# Patient Record
Sex: Female | Born: 1945 | Race: Black or African American | Hispanic: No | Marital: Married | Smoking: Former smoker
Health system: Southern US, Community
[De-identification: ages and names within clinical notes are randomized; demographics above are authoritative.]

## PROBLEM LIST (undated history)

## (undated) DIAGNOSIS — N189 Chronic kidney disease, unspecified: Secondary | ICD-10-CM

## (undated) DIAGNOSIS — E7800 Pure hypercholesterolemia, unspecified: Secondary | ICD-10-CM

## (undated) DIAGNOSIS — I10 Essential (primary) hypertension: Secondary | ICD-10-CM

## (undated) DIAGNOSIS — K219 Gastro-esophageal reflux disease without esophagitis: Secondary | ICD-10-CM

## (undated) DIAGNOSIS — E119 Type 2 diabetes mellitus without complications: Secondary | ICD-10-CM

## (undated) DIAGNOSIS — B259 Cytomegaloviral disease, unspecified: Secondary | ICD-10-CM

## (undated) DIAGNOSIS — M1990 Unspecified osteoarthritis, unspecified site: Secondary | ICD-10-CM

## (undated) HISTORY — PX: ABDOMINAL HYSTERECTOMY: SHX81

## (undated) HISTORY — PX: BREAST SURGERY: SHX581

## (undated) HISTORY — PX: BREAST CYST ASPIRATION: SHX578

## (undated) HISTORY — PX: TONSILLECTOMY: SUR1361

## (undated) HISTORY — PX: DILATION AND CURETTAGE OF UTERUS: SHX78

## (undated) HISTORY — PX: CHOLECYSTECTOMY: SHX55

---

## 2012-04-15 HISTORY — PX: KIDNEY TRANSPLANT: SHX239

## 2012-04-25 DIAGNOSIS — D649 Anemia, unspecified: Secondary | ICD-10-CM | POA: Diagnosis not present

## 2012-04-25 DIAGNOSIS — Q613 Polycystic kidney, unspecified: Secondary | ICD-10-CM | POA: Diagnosis not present

## 2012-04-28 DIAGNOSIS — E785 Hyperlipidemia, unspecified: Secondary | ICD-10-CM | POA: Diagnosis not present

## 2012-04-28 DIAGNOSIS — I4891 Unspecified atrial fibrillation: Secondary | ICD-10-CM | POA: Diagnosis not present

## 2012-04-28 DIAGNOSIS — E669 Obesity, unspecified: Secondary | ICD-10-CM | POA: Diagnosis not present

## 2012-04-28 DIAGNOSIS — Z940 Kidney transplant status: Secondary | ICD-10-CM | POA: Diagnosis not present

## 2012-04-28 DIAGNOSIS — N250 Renal osteodystrophy: Secondary | ICD-10-CM | POA: Diagnosis present

## 2012-04-28 DIAGNOSIS — E213 Hyperparathyroidism, unspecified: Secondary | ICD-10-CM | POA: Diagnosis not present

## 2012-04-28 DIAGNOSIS — R509 Fever, unspecified: Secondary | ICD-10-CM | POA: Diagnosis not present

## 2012-04-28 DIAGNOSIS — D259 Leiomyoma of uterus, unspecified: Secondary | ICD-10-CM | POA: Diagnosis not present

## 2012-04-28 DIAGNOSIS — I120 Hypertensive chronic kidney disease with stage 5 chronic kidney disease or end stage renal disease: Secondary | ICD-10-CM | POA: Diagnosis not present

## 2012-04-28 DIAGNOSIS — N186 End stage renal disease: Secondary | ICD-10-CM | POA: Diagnosis present

## 2012-04-28 DIAGNOSIS — R918 Other nonspecific abnormal finding of lung field: Secondary | ICD-10-CM | POA: Diagnosis not present

## 2012-04-28 DIAGNOSIS — N039 Chronic nephritic syndrome with unspecified morphologic changes: Secondary | ICD-10-CM | POA: Diagnosis present

## 2012-04-28 DIAGNOSIS — Z79899 Other long term (current) drug therapy: Secondary | ICD-10-CM | POA: Diagnosis not present

## 2012-04-28 DIAGNOSIS — Q613 Polycystic kidney, unspecified: Secondary | ICD-10-CM | POA: Diagnosis not present

## 2012-05-04 DIAGNOSIS — Z940 Kidney transplant status: Secondary | ICD-10-CM | POA: Diagnosis not present

## 2012-05-04 DIAGNOSIS — D8989 Other specified disorders involving the immune mechanism, not elsewhere classified: Secondary | ICD-10-CM | POA: Diagnosis not present

## 2012-05-04 DIAGNOSIS — Z79899 Other long term (current) drug therapy: Secondary | ICD-10-CM | POA: Diagnosis not present

## 2012-05-04 DIAGNOSIS — R7989 Other specified abnormal findings of blood chemistry: Secondary | ICD-10-CM | POA: Diagnosis not present

## 2012-05-04 DIAGNOSIS — D631 Anemia in chronic kidney disease: Secondary | ICD-10-CM | POA: Diagnosis not present

## 2012-05-04 DIAGNOSIS — N184 Chronic kidney disease, stage 4 (severe): Secondary | ICD-10-CM | POA: Diagnosis not present

## 2012-05-08 DIAGNOSIS — I10 Essential (primary) hypertension: Secondary | ICD-10-CM | POA: Diagnosis not present

## 2012-05-08 DIAGNOSIS — Z79899 Other long term (current) drug therapy: Secondary | ICD-10-CM | POA: Diagnosis not present

## 2012-05-08 DIAGNOSIS — N390 Urinary tract infection, site not specified: Secondary | ICD-10-CM | POA: Diagnosis not present

## 2012-05-08 DIAGNOSIS — Z940 Kidney transplant status: Secondary | ICD-10-CM | POA: Diagnosis not present

## 2012-05-08 DIAGNOSIS — R809 Proteinuria, unspecified: Secondary | ICD-10-CM | POA: Diagnosis not present

## 2012-05-11 DIAGNOSIS — I10 Essential (primary) hypertension: Secondary | ICD-10-CM | POA: Diagnosis not present

## 2012-05-11 DIAGNOSIS — E785 Hyperlipidemia, unspecified: Secondary | ICD-10-CM | POA: Diagnosis not present

## 2012-05-11 DIAGNOSIS — Z940 Kidney transplant status: Secondary | ICD-10-CM | POA: Diagnosis not present

## 2012-05-11 DIAGNOSIS — Z79899 Other long term (current) drug therapy: Secondary | ICD-10-CM | POA: Diagnosis not present

## 2012-05-15 DIAGNOSIS — Z79899 Other long term (current) drug therapy: Secondary | ICD-10-CM | POA: Diagnosis not present

## 2012-05-15 DIAGNOSIS — R7989 Other specified abnormal findings of blood chemistry: Secondary | ICD-10-CM | POA: Diagnosis not present

## 2012-05-15 DIAGNOSIS — D8989 Other specified disorders involving the immune mechanism, not elsewhere classified: Secondary | ICD-10-CM | POA: Diagnosis not present

## 2012-05-15 DIAGNOSIS — Z940 Kidney transplant status: Secondary | ICD-10-CM | POA: Diagnosis not present

## 2012-05-18 DIAGNOSIS — D509 Iron deficiency anemia, unspecified: Secondary | ICD-10-CM | POA: Diagnosis not present

## 2012-05-18 DIAGNOSIS — Z940 Kidney transplant status: Secondary | ICD-10-CM | POA: Diagnosis not present

## 2012-05-18 DIAGNOSIS — Z79899 Other long term (current) drug therapy: Secondary | ICD-10-CM | POA: Diagnosis not present

## 2012-05-18 DIAGNOSIS — N039 Chronic nephritic syndrome with unspecified morphologic changes: Secondary | ICD-10-CM | POA: Diagnosis not present

## 2012-05-18 DIAGNOSIS — R7989 Other specified abnormal findings of blood chemistry: Secondary | ICD-10-CM | POA: Diagnosis not present

## 2012-05-18 DIAGNOSIS — D631 Anemia in chronic kidney disease: Secondary | ICD-10-CM | POA: Diagnosis not present

## 2012-05-22 DIAGNOSIS — D8989 Other specified disorders involving the immune mechanism, not elsewhere classified: Secondary | ICD-10-CM | POA: Diagnosis not present

## 2012-05-22 DIAGNOSIS — N039 Chronic nephritic syndrome with unspecified morphologic changes: Secondary | ICD-10-CM | POA: Diagnosis not present

## 2012-05-22 DIAGNOSIS — Z79899 Other long term (current) drug therapy: Secondary | ICD-10-CM | POA: Diagnosis not present

## 2012-05-22 DIAGNOSIS — Z940 Kidney transplant status: Secondary | ICD-10-CM | POA: Diagnosis not present

## 2012-05-22 DIAGNOSIS — R7989 Other specified abnormal findings of blood chemistry: Secondary | ICD-10-CM | POA: Diagnosis not present

## 2012-05-25 DIAGNOSIS — R7989 Other specified abnormal findings of blood chemistry: Secondary | ICD-10-CM | POA: Diagnosis not present

## 2012-05-25 DIAGNOSIS — Z940 Kidney transplant status: Secondary | ICD-10-CM | POA: Diagnosis not present

## 2012-05-25 DIAGNOSIS — Z79899 Other long term (current) drug therapy: Secondary | ICD-10-CM | POA: Diagnosis not present

## 2012-05-25 DIAGNOSIS — N039 Chronic nephritic syndrome with unspecified morphologic changes: Secondary | ICD-10-CM | POA: Diagnosis not present

## 2012-05-25 DIAGNOSIS — D8989 Other specified disorders involving the immune mechanism, not elsewhere classified: Secondary | ICD-10-CM | POA: Diagnosis not present

## 2012-05-30 DIAGNOSIS — Z79899 Other long term (current) drug therapy: Secondary | ICD-10-CM | POA: Diagnosis not present

## 2012-05-30 DIAGNOSIS — R7989 Other specified abnormal findings of blood chemistry: Secondary | ICD-10-CM | POA: Diagnosis not present

## 2012-05-30 DIAGNOSIS — D8989 Other specified disorders involving the immune mechanism, not elsewhere classified: Secondary | ICD-10-CM | POA: Diagnosis not present

## 2012-05-30 DIAGNOSIS — Z940 Kidney transplant status: Secondary | ICD-10-CM | POA: Diagnosis not present

## 2012-06-06 DIAGNOSIS — Z79899 Other long term (current) drug therapy: Secondary | ICD-10-CM | POA: Diagnosis not present

## 2012-06-06 DIAGNOSIS — N390 Urinary tract infection, site not specified: Secondary | ICD-10-CM | POA: Diagnosis not present

## 2012-06-06 DIAGNOSIS — R7989 Other specified abnormal findings of blood chemistry: Secondary | ICD-10-CM | POA: Diagnosis not present

## 2012-06-06 DIAGNOSIS — N184 Chronic kidney disease, stage 4 (severe): Secondary | ICD-10-CM | POA: Diagnosis not present

## 2012-06-06 DIAGNOSIS — Z940 Kidney transplant status: Secondary | ICD-10-CM | POA: Diagnosis not present

## 2012-06-06 DIAGNOSIS — N039 Chronic nephritic syndrome with unspecified morphologic changes: Secondary | ICD-10-CM | POA: Diagnosis not present

## 2012-06-06 DIAGNOSIS — I10 Essential (primary) hypertension: Secondary | ICD-10-CM | POA: Diagnosis not present

## 2012-06-06 DIAGNOSIS — D631 Anemia in chronic kidney disease: Secondary | ICD-10-CM | POA: Diagnosis not present

## 2012-06-06 DIAGNOSIS — R809 Proteinuria, unspecified: Secondary | ICD-10-CM | POA: Diagnosis not present

## 2012-06-13 DIAGNOSIS — B259 Cytomegaloviral disease, unspecified: Secondary | ICD-10-CM | POA: Diagnosis not present

## 2012-06-13 DIAGNOSIS — T8610 Unspecified complication of kidney transplant: Secondary | ICD-10-CM | POA: Diagnosis not present

## 2012-06-21 DIAGNOSIS — N390 Urinary tract infection, site not specified: Secondary | ICD-10-CM | POA: Diagnosis not present

## 2012-06-21 DIAGNOSIS — R7989 Other specified abnormal findings of blood chemistry: Secondary | ICD-10-CM | POA: Diagnosis not present

## 2012-06-21 DIAGNOSIS — D8989 Other specified disorders involving the immune mechanism, not elsewhere classified: Secondary | ICD-10-CM | POA: Diagnosis not present

## 2012-06-21 DIAGNOSIS — R809 Proteinuria, unspecified: Secondary | ICD-10-CM | POA: Diagnosis not present

## 2012-06-21 DIAGNOSIS — Z940 Kidney transplant status: Secondary | ICD-10-CM | POA: Diagnosis not present

## 2012-06-21 DIAGNOSIS — B259 Cytomegaloviral disease, unspecified: Secondary | ICD-10-CM | POA: Diagnosis not present

## 2012-06-21 DIAGNOSIS — N039 Chronic nephritic syndrome with unspecified morphologic changes: Secondary | ICD-10-CM | POA: Diagnosis not present

## 2012-06-21 DIAGNOSIS — Z79899 Other long term (current) drug therapy: Secondary | ICD-10-CM | POA: Diagnosis not present

## 2012-06-25 DIAGNOSIS — T8610 Unspecified complication of kidney transplant: Secondary | ICD-10-CM | POA: Diagnosis not present

## 2012-06-25 DIAGNOSIS — Z940 Kidney transplant status: Secondary | ICD-10-CM | POA: Diagnosis not present

## 2012-06-25 DIAGNOSIS — B9789 Other viral agents as the cause of diseases classified elsewhere: Secondary | ICD-10-CM | POA: Diagnosis not present

## 2012-06-25 DIAGNOSIS — B259 Cytomegaloviral disease, unspecified: Secondary | ICD-10-CM | POA: Diagnosis not present

## 2012-06-25 DIAGNOSIS — Z79899 Other long term (current) drug therapy: Secondary | ICD-10-CM | POA: Diagnosis not present

## 2012-06-25 DIAGNOSIS — Z48298 Encounter for aftercare following other organ transplant: Secondary | ICD-10-CM | POA: Diagnosis not present

## 2012-07-04 DIAGNOSIS — R7989 Other specified abnormal findings of blood chemistry: Secondary | ICD-10-CM | POA: Diagnosis not present

## 2012-07-04 DIAGNOSIS — E119 Type 2 diabetes mellitus without complications: Secondary | ICD-10-CM | POA: Diagnosis not present

## 2012-07-04 DIAGNOSIS — R809 Proteinuria, unspecified: Secondary | ICD-10-CM | POA: Diagnosis not present

## 2012-07-04 DIAGNOSIS — B259 Cytomegaloviral disease, unspecified: Secondary | ICD-10-CM | POA: Diagnosis not present

## 2012-07-04 DIAGNOSIS — Z79899 Other long term (current) drug therapy: Secondary | ICD-10-CM | POA: Diagnosis not present

## 2012-07-04 DIAGNOSIS — D8989 Other specified disorders involving the immune mechanism, not elsewhere classified: Secondary | ICD-10-CM | POA: Diagnosis not present

## 2012-07-04 DIAGNOSIS — Z940 Kidney transplant status: Secondary | ICD-10-CM | POA: Diagnosis not present

## 2012-07-04 DIAGNOSIS — I10 Essential (primary) hypertension: Secondary | ICD-10-CM | POA: Diagnosis not present

## 2012-07-11 DIAGNOSIS — N039 Chronic nephritic syndrome with unspecified morphologic changes: Secondary | ICD-10-CM | POA: Diagnosis not present

## 2012-07-11 DIAGNOSIS — Z79899 Other long term (current) drug therapy: Secondary | ICD-10-CM | POA: Diagnosis not present

## 2012-07-11 DIAGNOSIS — Z940 Kidney transplant status: Secondary | ICD-10-CM | POA: Diagnosis not present

## 2012-07-11 DIAGNOSIS — D8989 Other specified disorders involving the immune mechanism, not elsewhere classified: Secondary | ICD-10-CM | POA: Diagnosis not present

## 2012-07-11 DIAGNOSIS — D631 Anemia in chronic kidney disease: Secondary | ICD-10-CM | POA: Diagnosis not present

## 2012-07-11 DIAGNOSIS — B259 Cytomegaloviral disease, unspecified: Secondary | ICD-10-CM | POA: Diagnosis not present

## 2012-07-18 DIAGNOSIS — Z940 Kidney transplant status: Secondary | ICD-10-CM | POA: Diagnosis not present

## 2012-07-18 DIAGNOSIS — Z79899 Other long term (current) drug therapy: Secondary | ICD-10-CM | POA: Diagnosis not present

## 2012-07-18 DIAGNOSIS — Z48298 Encounter for aftercare following other organ transplant: Secondary | ICD-10-CM | POA: Diagnosis not present

## 2012-07-18 DIAGNOSIS — B259 Cytomegaloviral disease, unspecified: Secondary | ICD-10-CM | POA: Diagnosis not present

## 2012-07-25 DIAGNOSIS — Z940 Kidney transplant status: Secondary | ICD-10-CM | POA: Diagnosis not present

## 2012-07-25 DIAGNOSIS — Z79899 Other long term (current) drug therapy: Secondary | ICD-10-CM | POA: Diagnosis not present

## 2012-07-27 DIAGNOSIS — E559 Vitamin D deficiency, unspecified: Secondary | ICD-10-CM | POA: Diagnosis not present

## 2012-07-27 DIAGNOSIS — I10 Essential (primary) hypertension: Secondary | ICD-10-CM | POA: Diagnosis not present

## 2012-07-27 DIAGNOSIS — Z940 Kidney transplant status: Secondary | ICD-10-CM | POA: Diagnosis not present

## 2012-07-27 DIAGNOSIS — E139 Other specified diabetes mellitus without complications: Secondary | ICD-10-CM | POA: Diagnosis not present

## 2012-07-27 DIAGNOSIS — E785 Hyperlipidemia, unspecified: Secondary | ICD-10-CM | POA: Diagnosis not present

## 2012-07-27 DIAGNOSIS — B259 Cytomegaloviral disease, unspecified: Secondary | ICD-10-CM | POA: Diagnosis not present

## 2012-08-01 DIAGNOSIS — N039 Chronic nephritic syndrome with unspecified morphologic changes: Secondary | ICD-10-CM | POA: Diagnosis not present

## 2012-08-01 DIAGNOSIS — B259 Cytomegaloviral disease, unspecified: Secondary | ICD-10-CM | POA: Diagnosis not present

## 2012-08-01 DIAGNOSIS — B9789 Other viral agents as the cause of diseases classified elsewhere: Secondary | ICD-10-CM | POA: Diagnosis not present

## 2012-08-01 DIAGNOSIS — E559 Vitamin D deficiency, unspecified: Secondary | ICD-10-CM | POA: Diagnosis not present

## 2012-08-01 DIAGNOSIS — R7989 Other specified abnormal findings of blood chemistry: Secondary | ICD-10-CM | POA: Diagnosis not present

## 2012-08-01 DIAGNOSIS — E119 Type 2 diabetes mellitus without complications: Secondary | ICD-10-CM | POA: Diagnosis not present

## 2012-08-01 DIAGNOSIS — Z940 Kidney transplant status: Secondary | ICD-10-CM | POA: Diagnosis not present

## 2012-08-01 DIAGNOSIS — Z79899 Other long term (current) drug therapy: Secondary | ICD-10-CM | POA: Diagnosis not present

## 2012-08-01 DIAGNOSIS — D8989 Other specified disorders involving the immune mechanism, not elsewhere classified: Secondary | ICD-10-CM | POA: Diagnosis not present

## 2012-08-01 DIAGNOSIS — D631 Anemia in chronic kidney disease: Secondary | ICD-10-CM | POA: Diagnosis not present

## 2012-08-07 DIAGNOSIS — Z79899 Other long term (current) drug therapy: Secondary | ICD-10-CM | POA: Diagnosis not present

## 2012-08-07 DIAGNOSIS — Z940 Kidney transplant status: Secondary | ICD-10-CM | POA: Diagnosis not present

## 2012-08-14 DIAGNOSIS — F819 Developmental disorder of scholastic skills, unspecified: Secondary | ICD-10-CM | POA: Diagnosis not present

## 2012-08-14 DIAGNOSIS — Z940 Kidney transplant status: Secondary | ICD-10-CM | POA: Diagnosis not present

## 2012-08-14 DIAGNOSIS — Z79899 Other long term (current) drug therapy: Secondary | ICD-10-CM | POA: Diagnosis not present

## 2012-08-20 DIAGNOSIS — E663 Overweight: Secondary | ICD-10-CM | POA: Diagnosis not present

## 2012-08-20 DIAGNOSIS — E785 Hyperlipidemia, unspecified: Secondary | ICD-10-CM | POA: Diagnosis not present

## 2012-08-20 DIAGNOSIS — Z0000 Encounter for general adult medical examination without abnormal findings: Secondary | ICD-10-CM | POA: Diagnosis not present

## 2012-08-20 DIAGNOSIS — I10 Essential (primary) hypertension: Secondary | ICD-10-CM | POA: Diagnosis not present

## 2012-08-22 DIAGNOSIS — Z940 Kidney transplant status: Secondary | ICD-10-CM | POA: Diagnosis not present

## 2012-08-22 DIAGNOSIS — Z79899 Other long term (current) drug therapy: Secondary | ICD-10-CM | POA: Diagnosis not present

## 2012-08-23 DIAGNOSIS — E139 Other specified diabetes mellitus without complications: Secondary | ICD-10-CM | POA: Diagnosis not present

## 2012-08-23 DIAGNOSIS — Z940 Kidney transplant status: Secondary | ICD-10-CM | POA: Diagnosis not present

## 2012-08-29 DIAGNOSIS — Z79899 Other long term (current) drug therapy: Secondary | ICD-10-CM | POA: Diagnosis not present

## 2012-08-29 DIAGNOSIS — R7989 Other specified abnormal findings of blood chemistry: Secondary | ICD-10-CM | POA: Diagnosis not present

## 2012-08-29 DIAGNOSIS — D8989 Other specified disorders involving the immune mechanism, not elsewhere classified: Secondary | ICD-10-CM | POA: Diagnosis not present

## 2012-08-29 DIAGNOSIS — Z940 Kidney transplant status: Secondary | ICD-10-CM | POA: Diagnosis not present

## 2012-09-05 DIAGNOSIS — Z79899 Other long term (current) drug therapy: Secondary | ICD-10-CM | POA: Diagnosis not present

## 2012-09-05 DIAGNOSIS — T8610 Unspecified complication of kidney transplant: Secondary | ICD-10-CM | POA: Diagnosis not present

## 2012-09-05 DIAGNOSIS — D631 Anemia in chronic kidney disease: Secondary | ICD-10-CM | POA: Diagnosis not present

## 2012-09-05 DIAGNOSIS — Z940 Kidney transplant status: Secondary | ICD-10-CM | POA: Diagnosis not present

## 2012-09-05 DIAGNOSIS — R7989 Other specified abnormal findings of blood chemistry: Secondary | ICD-10-CM | POA: Diagnosis not present

## 2012-09-05 DIAGNOSIS — D8989 Other specified disorders involving the immune mechanism, not elsewhere classified: Secondary | ICD-10-CM | POA: Diagnosis not present

## 2012-09-05 DIAGNOSIS — N039 Chronic nephritic syndrome with unspecified morphologic changes: Secondary | ICD-10-CM | POA: Diagnosis not present

## 2012-09-05 DIAGNOSIS — B259 Cytomegaloviral disease, unspecified: Secondary | ICD-10-CM | POA: Diagnosis not present

## 2012-09-10 DIAGNOSIS — E785 Hyperlipidemia, unspecified: Secondary | ICD-10-CM | POA: Diagnosis not present

## 2012-09-10 DIAGNOSIS — E139 Other specified diabetes mellitus without complications: Secondary | ICD-10-CM | POA: Diagnosis not present

## 2012-09-13 DIAGNOSIS — E559 Vitamin D deficiency, unspecified: Secondary | ICD-10-CM | POA: Diagnosis not present

## 2012-09-13 DIAGNOSIS — R7989 Other specified abnormal findings of blood chemistry: Secondary | ICD-10-CM | POA: Diagnosis not present

## 2012-09-13 DIAGNOSIS — E139 Other specified diabetes mellitus without complications: Secondary | ICD-10-CM | POA: Diagnosis not present

## 2012-09-13 DIAGNOSIS — I10 Essential (primary) hypertension: Secondary | ICD-10-CM | POA: Diagnosis not present

## 2012-09-13 DIAGNOSIS — E785 Hyperlipidemia, unspecified: Secondary | ICD-10-CM | POA: Diagnosis not present

## 2012-09-13 DIAGNOSIS — Z940 Kidney transplant status: Secondary | ICD-10-CM | POA: Diagnosis not present

## 2012-09-13 DIAGNOSIS — Z79899 Other long term (current) drug therapy: Secondary | ICD-10-CM | POA: Diagnosis not present

## 2012-09-13 DIAGNOSIS — D8989 Other specified disorders involving the immune mechanism, not elsewhere classified: Secondary | ICD-10-CM | POA: Diagnosis not present

## 2012-09-13 DIAGNOSIS — B259 Cytomegaloviral disease, unspecified: Secondary | ICD-10-CM | POA: Diagnosis not present

## 2012-09-17 ENCOUNTER — Other Ambulatory Visit: Payer: Self-pay | Admitting: Internal Medicine

## 2012-09-17 DIAGNOSIS — Z1231 Encounter for screening mammogram for malignant neoplasm of breast: Secondary | ICD-10-CM

## 2012-09-19 DIAGNOSIS — D8989 Other specified disorders involving the immune mechanism, not elsewhere classified: Secondary | ICD-10-CM | POA: Diagnosis not present

## 2012-09-19 DIAGNOSIS — Z79899 Other long term (current) drug therapy: Secondary | ICD-10-CM | POA: Diagnosis not present

## 2012-09-19 DIAGNOSIS — R7989 Other specified abnormal findings of blood chemistry: Secondary | ICD-10-CM | POA: Diagnosis not present

## 2012-09-19 DIAGNOSIS — Z940 Kidney transplant status: Secondary | ICD-10-CM | POA: Diagnosis not present

## 2012-09-26 DIAGNOSIS — D8989 Other specified disorders involving the immune mechanism, not elsewhere classified: Secondary | ICD-10-CM | POA: Diagnosis not present

## 2012-09-26 DIAGNOSIS — Z79899 Other long term (current) drug therapy: Secondary | ICD-10-CM | POA: Diagnosis not present

## 2012-09-26 DIAGNOSIS — R7989 Other specified abnormal findings of blood chemistry: Secondary | ICD-10-CM | POA: Diagnosis not present

## 2012-09-26 DIAGNOSIS — Z940 Kidney transplant status: Secondary | ICD-10-CM | POA: Diagnosis not present

## 2012-10-01 ENCOUNTER — Ambulatory Visit
Admission: RE | Admit: 2012-10-01 | Discharge: 2012-10-01 | Disposition: A | Discharge status: Home / Self Care | Payer: Medicare Other | Source: Ambulatory Visit | Attending: Internal Medicine | Admitting: Internal Medicine

## 2012-10-01 DIAGNOSIS — Z1231 Encounter for screening mammogram for malignant neoplasm of breast: Secondary | ICD-10-CM

## 2012-10-03 DIAGNOSIS — Z940 Kidney transplant status: Secondary | ICD-10-CM | POA: Diagnosis not present

## 2012-10-03 DIAGNOSIS — Z79899 Other long term (current) drug therapy: Secondary | ICD-10-CM | POA: Diagnosis not present

## 2012-10-17 DIAGNOSIS — Z940 Kidney transplant status: Secondary | ICD-10-CM | POA: Diagnosis not present

## 2012-10-17 DIAGNOSIS — D8989 Other specified disorders involving the immune mechanism, not elsewhere classified: Secondary | ICD-10-CM | POA: Diagnosis not present

## 2012-10-24 DIAGNOSIS — Z79899 Other long term (current) drug therapy: Secondary | ICD-10-CM | POA: Diagnosis not present

## 2012-11-02 DIAGNOSIS — E119 Type 2 diabetes mellitus without complications: Secondary | ICD-10-CM | POA: Diagnosis not present

## 2012-11-02 DIAGNOSIS — Z940 Kidney transplant status: Secondary | ICD-10-CM | POA: Diagnosis not present

## 2012-11-02 DIAGNOSIS — B259 Cytomegaloviral disease, unspecified: Secondary | ICD-10-CM | POA: Diagnosis not present

## 2012-11-02 DIAGNOSIS — Z79899 Other long term (current) drug therapy: Secondary | ICD-10-CM | POA: Diagnosis not present

## 2012-11-02 DIAGNOSIS — E559 Vitamin D deficiency, unspecified: Secondary | ICD-10-CM | POA: Diagnosis not present

## 2012-11-07 DIAGNOSIS — R7989 Other specified abnormal findings of blood chemistry: Secondary | ICD-10-CM | POA: Diagnosis not present

## 2012-11-07 DIAGNOSIS — Z79899 Other long term (current) drug therapy: Secondary | ICD-10-CM | POA: Diagnosis not present

## 2012-11-07 DIAGNOSIS — D8989 Other specified disorders involving the immune mechanism, not elsewhere classified: Secondary | ICD-10-CM | POA: Diagnosis not present

## 2012-11-07 DIAGNOSIS — Z940 Kidney transplant status: Secondary | ICD-10-CM | POA: Diagnosis not present

## 2012-11-13 DIAGNOSIS — E139 Other specified diabetes mellitus without complications: Secondary | ICD-10-CM | POA: Diagnosis not present

## 2012-11-13 DIAGNOSIS — E785 Hyperlipidemia, unspecified: Secondary | ICD-10-CM | POA: Diagnosis not present

## 2012-11-15 DIAGNOSIS — E785 Hyperlipidemia, unspecified: Secondary | ICD-10-CM | POA: Diagnosis not present

## 2012-11-15 DIAGNOSIS — Z940 Kidney transplant status: Secondary | ICD-10-CM | POA: Diagnosis not present

## 2012-11-15 DIAGNOSIS — I10 Essential (primary) hypertension: Secondary | ICD-10-CM | POA: Diagnosis not present

## 2012-11-15 DIAGNOSIS — R7989 Other specified abnormal findings of blood chemistry: Secondary | ICD-10-CM | POA: Diagnosis not present

## 2012-11-15 DIAGNOSIS — D8989 Other specified disorders involving the immune mechanism, not elsewhere classified: Secondary | ICD-10-CM | POA: Diagnosis not present

## 2012-11-15 DIAGNOSIS — E139 Other specified diabetes mellitus without complications: Secondary | ICD-10-CM | POA: Diagnosis not present

## 2012-11-15 DIAGNOSIS — B259 Cytomegaloviral disease, unspecified: Secondary | ICD-10-CM | POA: Diagnosis not present

## 2012-11-15 DIAGNOSIS — E559 Vitamin D deficiency, unspecified: Secondary | ICD-10-CM | POA: Diagnosis not present

## 2012-11-15 DIAGNOSIS — Z79899 Other long term (current) drug therapy: Secondary | ICD-10-CM | POA: Diagnosis not present

## 2012-11-21 DIAGNOSIS — D8989 Other specified disorders involving the immune mechanism, not elsewhere classified: Secondary | ICD-10-CM | POA: Diagnosis not present

## 2012-11-21 DIAGNOSIS — E785 Hyperlipidemia, unspecified: Secondary | ICD-10-CM | POA: Diagnosis not present

## 2012-11-21 DIAGNOSIS — N951 Menopausal and female climacteric states: Secondary | ICD-10-CM | POA: Diagnosis not present

## 2012-11-21 DIAGNOSIS — Z23 Encounter for immunization: Secondary | ICD-10-CM | POA: Diagnosis not present

## 2012-11-21 DIAGNOSIS — Z0000 Encounter for general adult medical examination without abnormal findings: Secondary | ICD-10-CM | POA: Diagnosis not present

## 2012-11-21 DIAGNOSIS — R7989 Other specified abnormal findings of blood chemistry: Secondary | ICD-10-CM | POA: Diagnosis not present

## 2012-11-21 DIAGNOSIS — E139 Other specified diabetes mellitus without complications: Secondary | ICD-10-CM | POA: Diagnosis not present

## 2012-11-21 DIAGNOSIS — Z940 Kidney transplant status: Secondary | ICD-10-CM | POA: Diagnosis not present

## 2012-11-21 DIAGNOSIS — I10 Essential (primary) hypertension: Secondary | ICD-10-CM | POA: Diagnosis not present

## 2012-11-21 DIAGNOSIS — Z79899 Other long term (current) drug therapy: Secondary | ICD-10-CM | POA: Diagnosis not present

## 2012-11-21 DIAGNOSIS — E663 Overweight: Secondary | ICD-10-CM | POA: Diagnosis not present

## 2012-11-28 DIAGNOSIS — D8989 Other specified disorders involving the immune mechanism, not elsewhere classified: Secondary | ICD-10-CM | POA: Diagnosis not present

## 2012-11-28 DIAGNOSIS — R7989 Other specified abnormal findings of blood chemistry: Secondary | ICD-10-CM | POA: Diagnosis not present

## 2012-11-28 DIAGNOSIS — Z1211 Encounter for screening for malignant neoplasm of colon: Secondary | ICD-10-CM | POA: Diagnosis not present

## 2012-11-28 DIAGNOSIS — Z940 Kidney transplant status: Secondary | ICD-10-CM | POA: Diagnosis not present

## 2012-11-28 DIAGNOSIS — Z79899 Other long term (current) drug therapy: Secondary | ICD-10-CM | POA: Diagnosis not present

## 2012-12-05 DIAGNOSIS — D8989 Other specified disorders involving the immune mechanism, not elsewhere classified: Secondary | ICD-10-CM | POA: Diagnosis not present

## 2012-12-05 DIAGNOSIS — I10 Essential (primary) hypertension: Secondary | ICD-10-CM | POA: Diagnosis not present

## 2012-12-05 DIAGNOSIS — D631 Anemia in chronic kidney disease: Secondary | ICD-10-CM | POA: Diagnosis not present

## 2012-12-05 DIAGNOSIS — Z940 Kidney transplant status: Secondary | ICD-10-CM | POA: Diagnosis not present

## 2012-12-05 DIAGNOSIS — B259 Cytomegaloviral disease, unspecified: Secondary | ICD-10-CM | POA: Diagnosis not present

## 2012-12-05 DIAGNOSIS — R7989 Other specified abnormal findings of blood chemistry: Secondary | ICD-10-CM | POA: Diagnosis not present

## 2012-12-05 DIAGNOSIS — N039 Chronic nephritic syndrome with unspecified morphologic changes: Secondary | ICD-10-CM | POA: Diagnosis not present

## 2012-12-05 DIAGNOSIS — Z79899 Other long term (current) drug therapy: Secondary | ICD-10-CM | POA: Diagnosis not present

## 2012-12-13 DIAGNOSIS — D8989 Other specified disorders involving the immune mechanism, not elsewhere classified: Secondary | ICD-10-CM | POA: Diagnosis not present

## 2012-12-13 DIAGNOSIS — Z79899 Other long term (current) drug therapy: Secondary | ICD-10-CM | POA: Diagnosis not present

## 2012-12-13 DIAGNOSIS — Z940 Kidney transplant status: Secondary | ICD-10-CM | POA: Diagnosis not present

## 2012-12-13 DIAGNOSIS — R7989 Other specified abnormal findings of blood chemistry: Secondary | ICD-10-CM | POA: Diagnosis not present

## 2012-12-20 DIAGNOSIS — Z940 Kidney transplant status: Secondary | ICD-10-CM | POA: Diagnosis not present

## 2012-12-20 DIAGNOSIS — R7989 Other specified abnormal findings of blood chemistry: Secondary | ICD-10-CM | POA: Diagnosis not present

## 2012-12-20 DIAGNOSIS — Z79899 Other long term (current) drug therapy: Secondary | ICD-10-CM | POA: Diagnosis not present

## 2012-12-20 DIAGNOSIS — D8989 Other specified disorders involving the immune mechanism, not elsewhere classified: Secondary | ICD-10-CM | POA: Diagnosis not present

## 2012-12-26 DIAGNOSIS — Z79899 Other long term (current) drug therapy: Secondary | ICD-10-CM | POA: Diagnosis not present

## 2012-12-26 DIAGNOSIS — R7989 Other specified abnormal findings of blood chemistry: Secondary | ICD-10-CM | POA: Diagnosis not present

## 2012-12-26 DIAGNOSIS — N951 Menopausal and female climacteric states: Secondary | ICD-10-CM | POA: Diagnosis not present

## 2012-12-26 DIAGNOSIS — Z940 Kidney transplant status: Secondary | ICD-10-CM | POA: Diagnosis not present

## 2012-12-26 DIAGNOSIS — D8989 Other specified disorders involving the immune mechanism, not elsewhere classified: Secondary | ICD-10-CM | POA: Diagnosis not present

## 2013-01-02 DIAGNOSIS — N039 Chronic nephritic syndrome with unspecified morphologic changes: Secondary | ICD-10-CM | POA: Diagnosis not present

## 2013-01-02 DIAGNOSIS — Z940 Kidney transplant status: Secondary | ICD-10-CM | POA: Diagnosis not present

## 2013-01-02 DIAGNOSIS — B259 Cytomegaloviral disease, unspecified: Secondary | ICD-10-CM | POA: Diagnosis not present

## 2013-01-02 DIAGNOSIS — D631 Anemia in chronic kidney disease: Secondary | ICD-10-CM | POA: Diagnosis not present

## 2013-01-02 DIAGNOSIS — I10 Essential (primary) hypertension: Secondary | ICD-10-CM | POA: Diagnosis not present

## 2013-01-02 DIAGNOSIS — Z79899 Other long term (current) drug therapy: Secondary | ICD-10-CM | POA: Diagnosis not present

## 2013-01-09 DIAGNOSIS — D8989 Other specified disorders involving the immune mechanism, not elsewhere classified: Secondary | ICD-10-CM | POA: Diagnosis not present

## 2013-01-09 DIAGNOSIS — Z940 Kidney transplant status: Secondary | ICD-10-CM | POA: Diagnosis not present

## 2013-01-09 DIAGNOSIS — Z79899 Other long term (current) drug therapy: Secondary | ICD-10-CM | POA: Diagnosis not present

## 2013-01-09 DIAGNOSIS — R7989 Other specified abnormal findings of blood chemistry: Secondary | ICD-10-CM | POA: Diagnosis not present

## 2013-01-14 DIAGNOSIS — E785 Hyperlipidemia, unspecified: Secondary | ICD-10-CM | POA: Diagnosis not present

## 2013-01-14 DIAGNOSIS — E139 Other specified diabetes mellitus without complications: Secondary | ICD-10-CM | POA: Diagnosis not present

## 2013-01-16 DIAGNOSIS — Z940 Kidney transplant status: Secondary | ICD-10-CM | POA: Diagnosis not present

## 2013-01-16 DIAGNOSIS — R7989 Other specified abnormal findings of blood chemistry: Secondary | ICD-10-CM | POA: Diagnosis not present

## 2013-01-16 DIAGNOSIS — E785 Hyperlipidemia, unspecified: Secondary | ICD-10-CM | POA: Diagnosis not present

## 2013-01-16 DIAGNOSIS — E559 Vitamin D deficiency, unspecified: Secondary | ICD-10-CM | POA: Diagnosis not present

## 2013-01-16 DIAGNOSIS — Z79899 Other long term (current) drug therapy: Secondary | ICD-10-CM | POA: Diagnosis not present

## 2013-01-16 DIAGNOSIS — B259 Cytomegaloviral disease, unspecified: Secondary | ICD-10-CM | POA: Diagnosis not present

## 2013-01-16 DIAGNOSIS — D8989 Other specified disorders involving the immune mechanism, not elsewhere classified: Secondary | ICD-10-CM | POA: Diagnosis not present

## 2013-01-16 DIAGNOSIS — I10 Essential (primary) hypertension: Secondary | ICD-10-CM | POA: Diagnosis not present

## 2013-01-16 DIAGNOSIS — E139 Other specified diabetes mellitus without complications: Secondary | ICD-10-CM | POA: Diagnosis not present

## 2013-01-23 DIAGNOSIS — Z940 Kidney transplant status: Secondary | ICD-10-CM | POA: Diagnosis not present

## 2013-01-23 DIAGNOSIS — R7989 Other specified abnormal findings of blood chemistry: Secondary | ICD-10-CM | POA: Diagnosis not present

## 2013-01-23 DIAGNOSIS — D8989 Other specified disorders involving the immune mechanism, not elsewhere classified: Secondary | ICD-10-CM | POA: Diagnosis not present

## 2013-01-23 DIAGNOSIS — Z79899 Other long term (current) drug therapy: Secondary | ICD-10-CM | POA: Diagnosis not present

## 2013-01-30 DIAGNOSIS — Z79899 Other long term (current) drug therapy: Secondary | ICD-10-CM | POA: Diagnosis not present

## 2013-01-30 DIAGNOSIS — D8989 Other specified disorders involving the immune mechanism, not elsewhere classified: Secondary | ICD-10-CM | POA: Diagnosis not present

## 2013-01-30 DIAGNOSIS — Z940 Kidney transplant status: Secondary | ICD-10-CM | POA: Diagnosis not present

## 2013-01-30 DIAGNOSIS — R7989 Other specified abnormal findings of blood chemistry: Secondary | ICD-10-CM | POA: Diagnosis not present

## 2013-02-06 DIAGNOSIS — E559 Vitamin D deficiency, unspecified: Secondary | ICD-10-CM | POA: Diagnosis not present

## 2013-02-06 DIAGNOSIS — D509 Iron deficiency anemia, unspecified: Secondary | ICD-10-CM | POA: Diagnosis not present

## 2013-02-06 DIAGNOSIS — Z79899 Other long term (current) drug therapy: Secondary | ICD-10-CM | POA: Diagnosis not present

## 2013-02-06 DIAGNOSIS — Z940 Kidney transplant status: Secondary | ICD-10-CM | POA: Diagnosis not present

## 2013-02-06 DIAGNOSIS — B259 Cytomegaloviral disease, unspecified: Secondary | ICD-10-CM | POA: Diagnosis not present

## 2013-02-11 DIAGNOSIS — T8610 Unspecified complication of kidney transplant: Secondary | ICD-10-CM | POA: Diagnosis not present

## 2013-02-11 DIAGNOSIS — B259 Cytomegaloviral disease, unspecified: Secondary | ICD-10-CM | POA: Diagnosis not present

## 2013-02-13 DIAGNOSIS — Z79899 Other long term (current) drug therapy: Secondary | ICD-10-CM | POA: Diagnosis not present

## 2013-02-13 DIAGNOSIS — Z940 Kidney transplant status: Secondary | ICD-10-CM | POA: Diagnosis not present

## 2013-02-13 DIAGNOSIS — D8989 Other specified disorders involving the immune mechanism, not elsewhere classified: Secondary | ICD-10-CM | POA: Diagnosis not present

## 2013-02-13 DIAGNOSIS — R7989 Other specified abnormal findings of blood chemistry: Secondary | ICD-10-CM | POA: Diagnosis not present

## 2013-02-20 DIAGNOSIS — R7989 Other specified abnormal findings of blood chemistry: Secondary | ICD-10-CM | POA: Diagnosis not present

## 2013-02-20 DIAGNOSIS — Z940 Kidney transplant status: Secondary | ICD-10-CM | POA: Diagnosis not present

## 2013-02-20 DIAGNOSIS — Z79899 Other long term (current) drug therapy: Secondary | ICD-10-CM | POA: Diagnosis not present

## 2013-02-20 DIAGNOSIS — D8989 Other specified disorders involving the immune mechanism, not elsewhere classified: Secondary | ICD-10-CM | POA: Diagnosis not present

## 2013-02-26 DIAGNOSIS — Z79899 Other long term (current) drug therapy: Secondary | ICD-10-CM | POA: Diagnosis not present

## 2013-02-26 DIAGNOSIS — D8989 Other specified disorders involving the immune mechanism, not elsewhere classified: Secondary | ICD-10-CM | POA: Diagnosis not present

## 2013-02-26 DIAGNOSIS — Z940 Kidney transplant status: Secondary | ICD-10-CM | POA: Diagnosis not present

## 2013-02-26 DIAGNOSIS — R7989 Other specified abnormal findings of blood chemistry: Secondary | ICD-10-CM | POA: Diagnosis not present

## 2013-02-28 DIAGNOSIS — T8610 Unspecified complication of kidney transplant: Secondary | ICD-10-CM | POA: Diagnosis not present

## 2013-02-28 DIAGNOSIS — D709 Neutropenia, unspecified: Secondary | ICD-10-CM | POA: Diagnosis not present

## 2013-03-01 DIAGNOSIS — D709 Neutropenia, unspecified: Secondary | ICD-10-CM | POA: Diagnosis not present

## 2013-03-06 DIAGNOSIS — T8610 Unspecified complication of kidney transplant: Secondary | ICD-10-CM | POA: Diagnosis not present

## 2013-03-06 DIAGNOSIS — I10 Essential (primary) hypertension: Secondary | ICD-10-CM | POA: Diagnosis not present

## 2013-03-06 DIAGNOSIS — Z79899 Other long term (current) drug therapy: Secondary | ICD-10-CM | POA: Diagnosis not present

## 2013-03-06 DIAGNOSIS — B259 Cytomegaloviral disease, unspecified: Secondary | ICD-10-CM | POA: Diagnosis not present

## 2013-03-06 DIAGNOSIS — Z940 Kidney transplant status: Secondary | ICD-10-CM | POA: Diagnosis not present

## 2013-03-12 DIAGNOSIS — R7989 Other specified abnormal findings of blood chemistry: Secondary | ICD-10-CM | POA: Diagnosis not present

## 2013-03-12 DIAGNOSIS — D8989 Other specified disorders involving the immune mechanism, not elsewhere classified: Secondary | ICD-10-CM | POA: Diagnosis not present

## 2013-03-12 DIAGNOSIS — Z79899 Other long term (current) drug therapy: Secondary | ICD-10-CM | POA: Diagnosis not present

## 2013-03-12 DIAGNOSIS — Z940 Kidney transplant status: Secondary | ICD-10-CM | POA: Diagnosis not present

## 2013-03-19 DIAGNOSIS — Z79899 Other long term (current) drug therapy: Secondary | ICD-10-CM | POA: Diagnosis not present

## 2013-03-19 DIAGNOSIS — D8989 Other specified disorders involving the immune mechanism, not elsewhere classified: Secondary | ICD-10-CM | POA: Diagnosis not present

## 2013-03-19 DIAGNOSIS — R7989 Other specified abnormal findings of blood chemistry: Secondary | ICD-10-CM | POA: Diagnosis not present

## 2013-03-19 DIAGNOSIS — Z940 Kidney transplant status: Secondary | ICD-10-CM | POA: Diagnosis not present

## 2013-03-26 DIAGNOSIS — Z79899 Other long term (current) drug therapy: Secondary | ICD-10-CM | POA: Diagnosis not present

## 2013-03-26 DIAGNOSIS — Z940 Kidney transplant status: Secondary | ICD-10-CM | POA: Diagnosis not present

## 2013-03-26 DIAGNOSIS — D8989 Other specified disorders involving the immune mechanism, not elsewhere classified: Secondary | ICD-10-CM | POA: Diagnosis not present

## 2013-03-26 DIAGNOSIS — R7989 Other specified abnormal findings of blood chemistry: Secondary | ICD-10-CM | POA: Diagnosis not present

## 2013-04-02 DIAGNOSIS — D8989 Other specified disorders involving the immune mechanism, not elsewhere classified: Secondary | ICD-10-CM | POA: Diagnosis not present

## 2013-04-02 DIAGNOSIS — R7989 Other specified abnormal findings of blood chemistry: Secondary | ICD-10-CM | POA: Diagnosis not present

## 2013-04-02 DIAGNOSIS — Z940 Kidney transplant status: Secondary | ICD-10-CM | POA: Diagnosis not present

## 2013-04-02 DIAGNOSIS — Z79899 Other long term (current) drug therapy: Secondary | ICD-10-CM | POA: Diagnosis not present

## 2013-04-10 DIAGNOSIS — Z79899 Other long term (current) drug therapy: Secondary | ICD-10-CM | POA: Diagnosis not present

## 2013-04-10 DIAGNOSIS — D72819 Decreased white blood cell count, unspecified: Secondary | ICD-10-CM | POA: Diagnosis not present

## 2013-04-10 DIAGNOSIS — Z48298 Encounter for aftercare following other organ transplant: Secondary | ICD-10-CM | POA: Diagnosis not present

## 2013-04-10 DIAGNOSIS — I10 Essential (primary) hypertension: Secondary | ICD-10-CM | POA: Diagnosis not present

## 2013-04-10 DIAGNOSIS — B259 Cytomegaloviral disease, unspecified: Secondary | ICD-10-CM | POA: Diagnosis not present

## 2013-04-10 DIAGNOSIS — N184 Chronic kidney disease, stage 4 (severe): Secondary | ICD-10-CM | POA: Diagnosis not present

## 2013-04-10 DIAGNOSIS — T8610 Unspecified complication of kidney transplant: Secondary | ICD-10-CM | POA: Diagnosis not present

## 2013-04-10 DIAGNOSIS — Z940 Kidney transplant status: Secondary | ICD-10-CM | POA: Diagnosis not present

## 2013-04-16 DIAGNOSIS — N184 Chronic kidney disease, stage 4 (severe): Secondary | ICD-10-CM | POA: Diagnosis not present

## 2013-04-16 DIAGNOSIS — Z940 Kidney transplant status: Secondary | ICD-10-CM | POA: Diagnosis not present

## 2013-04-16 DIAGNOSIS — Z79899 Other long term (current) drug therapy: Secondary | ICD-10-CM | POA: Diagnosis not present

## 2013-04-16 DIAGNOSIS — D8989 Other specified disorders involving the immune mechanism, not elsewhere classified: Secondary | ICD-10-CM | POA: Diagnosis not present

## 2013-04-16 DIAGNOSIS — R7989 Other specified abnormal findings of blood chemistry: Secondary | ICD-10-CM | POA: Diagnosis not present

## 2013-04-22 DIAGNOSIS — E785 Hyperlipidemia, unspecified: Secondary | ICD-10-CM | POA: Diagnosis not present

## 2013-04-22 DIAGNOSIS — Z79899 Other long term (current) drug therapy: Secondary | ICD-10-CM | POA: Diagnosis not present

## 2013-04-22 DIAGNOSIS — Z940 Kidney transplant status: Secondary | ICD-10-CM | POA: Diagnosis not present

## 2013-04-22 DIAGNOSIS — B259 Cytomegaloviral disease, unspecified: Secondary | ICD-10-CM | POA: Diagnosis not present

## 2013-04-23 DIAGNOSIS — D8989 Other specified disorders involving the immune mechanism, not elsewhere classified: Secondary | ICD-10-CM | POA: Diagnosis not present

## 2013-04-23 DIAGNOSIS — Z940 Kidney transplant status: Secondary | ICD-10-CM | POA: Diagnosis not present

## 2013-04-23 DIAGNOSIS — Z79899 Other long term (current) drug therapy: Secondary | ICD-10-CM | POA: Diagnosis not present

## 2013-04-23 DIAGNOSIS — R7989 Other specified abnormal findings of blood chemistry: Secondary | ICD-10-CM | POA: Diagnosis not present

## 2013-04-24 DIAGNOSIS — T8610 Unspecified complication of kidney transplant: Secondary | ICD-10-CM | POA: Diagnosis not present

## 2013-04-24 DIAGNOSIS — B259 Cytomegaloviral disease, unspecified: Secondary | ICD-10-CM | POA: Diagnosis not present

## 2013-04-30 DIAGNOSIS — E785 Hyperlipidemia, unspecified: Secondary | ICD-10-CM | POA: Diagnosis not present

## 2013-04-30 DIAGNOSIS — Z940 Kidney transplant status: Secondary | ICD-10-CM | POA: Diagnosis not present

## 2013-04-30 DIAGNOSIS — I10 Essential (primary) hypertension: Secondary | ICD-10-CM | POA: Diagnosis not present

## 2013-04-30 DIAGNOSIS — N189 Chronic kidney disease, unspecified: Secondary | ICD-10-CM | POA: Diagnosis not present

## 2013-04-30 DIAGNOSIS — B259 Cytomegaloviral disease, unspecified: Secondary | ICD-10-CM | POA: Diagnosis not present

## 2013-04-30 DIAGNOSIS — Z79899 Other long term (current) drug therapy: Secondary | ICD-10-CM | POA: Diagnosis not present

## 2013-04-30 DIAGNOSIS — D8989 Other specified disorders involving the immune mechanism, not elsewhere classified: Secondary | ICD-10-CM | POA: Diagnosis not present

## 2013-04-30 DIAGNOSIS — R7989 Other specified abnormal findings of blood chemistry: Secondary | ICD-10-CM | POA: Diagnosis not present

## 2013-04-30 DIAGNOSIS — T8610 Unspecified complication of kidney transplant: Secondary | ICD-10-CM | POA: Diagnosis not present

## 2013-05-07 DIAGNOSIS — D8989 Other specified disorders involving the immune mechanism, not elsewhere classified: Secondary | ICD-10-CM | POA: Diagnosis not present

## 2013-05-07 DIAGNOSIS — Z940 Kidney transplant status: Secondary | ICD-10-CM | POA: Diagnosis not present

## 2013-05-07 DIAGNOSIS — Z79899 Other long term (current) drug therapy: Secondary | ICD-10-CM | POA: Diagnosis not present

## 2013-05-07 DIAGNOSIS — R7989 Other specified abnormal findings of blood chemistry: Secondary | ICD-10-CM | POA: Diagnosis not present

## 2013-05-14 DIAGNOSIS — Z79899 Other long term (current) drug therapy: Secondary | ICD-10-CM | POA: Diagnosis not present

## 2013-05-14 DIAGNOSIS — Z48298 Encounter for aftercare following other organ transplant: Secondary | ICD-10-CM | POA: Diagnosis not present

## 2013-05-14 DIAGNOSIS — B259 Cytomegaloviral disease, unspecified: Secondary | ICD-10-CM | POA: Diagnosis not present

## 2013-05-14 DIAGNOSIS — Z23 Encounter for immunization: Secondary | ICD-10-CM | POA: Diagnosis not present

## 2013-05-14 DIAGNOSIS — I10 Essential (primary) hypertension: Secondary | ICD-10-CM | POA: Diagnosis not present

## 2013-05-14 DIAGNOSIS — T8610 Unspecified complication of kidney transplant: Secondary | ICD-10-CM | POA: Diagnosis not present

## 2013-05-14 DIAGNOSIS — Z940 Kidney transplant status: Secondary | ICD-10-CM | POA: Diagnosis not present

## 2013-05-16 DIAGNOSIS — D6489 Other specified anemias: Secondary | ICD-10-CM | POA: Diagnosis not present

## 2013-05-16 DIAGNOSIS — E669 Obesity, unspecified: Secondary | ICD-10-CM | POA: Diagnosis not present

## 2013-05-16 DIAGNOSIS — Z940 Kidney transplant status: Secondary | ICD-10-CM | POA: Diagnosis not present

## 2013-05-16 DIAGNOSIS — I4891 Unspecified atrial fibrillation: Secondary | ICD-10-CM | POA: Diagnosis not present

## 2013-05-16 DIAGNOSIS — E1129 Type 2 diabetes mellitus with other diabetic kidney complication: Secondary | ICD-10-CM | POA: Diagnosis not present

## 2013-05-16 DIAGNOSIS — I10 Essential (primary) hypertension: Secondary | ICD-10-CM | POA: Diagnosis not present

## 2013-05-16 DIAGNOSIS — R002 Palpitations: Secondary | ICD-10-CM | POA: Diagnosis not present

## 2013-05-16 DIAGNOSIS — E785 Hyperlipidemia, unspecified: Secondary | ICD-10-CM | POA: Diagnosis not present

## 2013-05-17 DIAGNOSIS — E785 Hyperlipidemia, unspecified: Secondary | ICD-10-CM | POA: Diagnosis not present

## 2013-05-17 DIAGNOSIS — D6489 Other specified anemias: Secondary | ICD-10-CM | POA: Diagnosis not present

## 2013-05-17 DIAGNOSIS — I4891 Unspecified atrial fibrillation: Secondary | ICD-10-CM | POA: Diagnosis not present

## 2013-05-17 DIAGNOSIS — E669 Obesity, unspecified: Secondary | ICD-10-CM | POA: Diagnosis not present

## 2013-05-17 DIAGNOSIS — E1129 Type 2 diabetes mellitus with other diabetic kidney complication: Secondary | ICD-10-CM | POA: Diagnosis not present

## 2013-05-17 DIAGNOSIS — I10 Essential (primary) hypertension: Secondary | ICD-10-CM | POA: Diagnosis not present

## 2013-05-17 DIAGNOSIS — Z940 Kidney transplant status: Secondary | ICD-10-CM | POA: Diagnosis not present

## 2013-05-17 DIAGNOSIS — R002 Palpitations: Secondary | ICD-10-CM | POA: Diagnosis not present

## 2013-05-20 DIAGNOSIS — E785 Hyperlipidemia, unspecified: Secondary | ICD-10-CM | POA: Diagnosis not present

## 2013-05-20 DIAGNOSIS — I10 Essential (primary) hypertension: Secondary | ICD-10-CM | POA: Diagnosis not present

## 2013-05-20 DIAGNOSIS — Z940 Kidney transplant status: Secondary | ICD-10-CM | POA: Diagnosis not present

## 2013-05-20 DIAGNOSIS — E559 Vitamin D deficiency, unspecified: Secondary | ICD-10-CM | POA: Diagnosis not present

## 2013-05-20 DIAGNOSIS — E139 Other specified diabetes mellitus without complications: Secondary | ICD-10-CM | POA: Diagnosis not present

## 2013-05-20 DIAGNOSIS — B259 Cytomegaloviral disease, unspecified: Secondary | ICD-10-CM | POA: Diagnosis not present

## 2013-05-21 DIAGNOSIS — Z940 Kidney transplant status: Secondary | ICD-10-CM | POA: Diagnosis not present

## 2013-05-21 DIAGNOSIS — R7989 Other specified abnormal findings of blood chemistry: Secondary | ICD-10-CM | POA: Diagnosis not present

## 2013-05-21 DIAGNOSIS — E785 Hyperlipidemia, unspecified: Secondary | ICD-10-CM | POA: Diagnosis not present

## 2013-05-21 DIAGNOSIS — I10 Essential (primary) hypertension: Secondary | ICD-10-CM | POA: Diagnosis not present

## 2013-05-21 DIAGNOSIS — D8989 Other specified disorders involving the immune mechanism, not elsewhere classified: Secondary | ICD-10-CM | POA: Diagnosis not present

## 2013-05-21 DIAGNOSIS — N189 Chronic kidney disease, unspecified: Secondary | ICD-10-CM | POA: Diagnosis not present

## 2013-05-21 DIAGNOSIS — Z79899 Other long term (current) drug therapy: Secondary | ICD-10-CM | POA: Diagnosis not present

## 2013-05-22 DIAGNOSIS — I10 Essential (primary) hypertension: Secondary | ICD-10-CM | POA: Diagnosis not present

## 2013-05-22 DIAGNOSIS — E139 Other specified diabetes mellitus without complications: Secondary | ICD-10-CM | POA: Diagnosis not present

## 2013-05-22 DIAGNOSIS — E785 Hyperlipidemia, unspecified: Secondary | ICD-10-CM | POA: Diagnosis not present

## 2013-05-22 DIAGNOSIS — R0602 Shortness of breath: Secondary | ICD-10-CM | POA: Diagnosis not present

## 2013-05-28 DIAGNOSIS — Z940 Kidney transplant status: Secondary | ICD-10-CM | POA: Diagnosis not present

## 2013-05-28 DIAGNOSIS — I10 Essential (primary) hypertension: Secondary | ICD-10-CM | POA: Diagnosis not present

## 2013-05-28 DIAGNOSIS — Z79899 Other long term (current) drug therapy: Secondary | ICD-10-CM | POA: Diagnosis not present

## 2013-05-28 DIAGNOSIS — N189 Chronic kidney disease, unspecified: Secondary | ICD-10-CM | POA: Diagnosis not present

## 2013-05-28 DIAGNOSIS — D8989 Other specified disorders involving the immune mechanism, not elsewhere classified: Secondary | ICD-10-CM | POA: Diagnosis not present

## 2013-05-28 DIAGNOSIS — E785 Hyperlipidemia, unspecified: Secondary | ICD-10-CM | POA: Diagnosis not present

## 2013-05-28 DIAGNOSIS — R7989 Other specified abnormal findings of blood chemistry: Secondary | ICD-10-CM | POA: Diagnosis not present

## 2013-06-04 DIAGNOSIS — Z940 Kidney transplant status: Secondary | ICD-10-CM | POA: Diagnosis not present

## 2013-06-04 DIAGNOSIS — Z79899 Other long term (current) drug therapy: Secondary | ICD-10-CM | POA: Diagnosis not present

## 2013-06-04 DIAGNOSIS — I10 Essential (primary) hypertension: Secondary | ICD-10-CM | POA: Diagnosis not present

## 2013-06-04 DIAGNOSIS — D8989 Other specified disorders involving the immune mechanism, not elsewhere classified: Secondary | ICD-10-CM | POA: Diagnosis not present

## 2013-06-04 DIAGNOSIS — R7989 Other specified abnormal findings of blood chemistry: Secondary | ICD-10-CM | POA: Diagnosis not present

## 2013-06-04 DIAGNOSIS — N189 Chronic kidney disease, unspecified: Secondary | ICD-10-CM | POA: Diagnosis not present

## 2013-06-04 DIAGNOSIS — E785 Hyperlipidemia, unspecified: Secondary | ICD-10-CM | POA: Diagnosis not present

## 2013-06-11 DIAGNOSIS — R7989 Other specified abnormal findings of blood chemistry: Secondary | ICD-10-CM | POA: Diagnosis not present

## 2013-06-11 DIAGNOSIS — Z940 Kidney transplant status: Secondary | ICD-10-CM | POA: Diagnosis not present

## 2013-06-11 DIAGNOSIS — B259 Cytomegaloviral disease, unspecified: Secondary | ICD-10-CM | POA: Diagnosis not present

## 2013-06-11 DIAGNOSIS — Z48298 Encounter for aftercare following other organ transplant: Secondary | ICD-10-CM | POA: Diagnosis not present

## 2013-06-11 DIAGNOSIS — D8989 Other specified disorders involving the immune mechanism, not elsewhere classified: Secondary | ICD-10-CM | POA: Diagnosis not present

## 2013-06-11 DIAGNOSIS — T8610 Unspecified complication of kidney transplant: Secondary | ICD-10-CM | POA: Diagnosis not present

## 2013-06-11 DIAGNOSIS — Z79899 Other long term (current) drug therapy: Secondary | ICD-10-CM | POA: Diagnosis not present

## 2013-06-13 DIAGNOSIS — T8610 Unspecified complication of kidney transplant: Secondary | ICD-10-CM | POA: Diagnosis not present

## 2013-06-13 DIAGNOSIS — Z79899 Other long term (current) drug therapy: Secondary | ICD-10-CM | POA: Diagnosis not present

## 2013-06-13 DIAGNOSIS — I10 Essential (primary) hypertension: Secondary | ICD-10-CM | POA: Diagnosis not present

## 2013-06-13 DIAGNOSIS — B259 Cytomegaloviral disease, unspecified: Secondary | ICD-10-CM | POA: Diagnosis not present

## 2013-06-13 DIAGNOSIS — Z940 Kidney transplant status: Secondary | ICD-10-CM | POA: Diagnosis not present

## 2013-06-13 DIAGNOSIS — Z48298 Encounter for aftercare following other organ transplant: Secondary | ICD-10-CM | POA: Diagnosis not present

## 2013-06-18 DIAGNOSIS — E1129 Type 2 diabetes mellitus with other diabetic kidney complication: Secondary | ICD-10-CM | POA: Diagnosis not present

## 2013-06-18 DIAGNOSIS — Z940 Kidney transplant status: Secondary | ICD-10-CM | POA: Diagnosis not present

## 2013-06-18 DIAGNOSIS — N184 Chronic kidney disease, stage 4 (severe): Secondary | ICD-10-CM | POA: Diagnosis not present

## 2013-06-18 DIAGNOSIS — Q612 Polycystic kidney, adult type: Secondary | ICD-10-CM | POA: Diagnosis not present

## 2013-06-18 DIAGNOSIS — D8989 Other specified disorders involving the immune mechanism, not elsewhere classified: Secondary | ICD-10-CM | POA: Diagnosis not present

## 2013-06-18 DIAGNOSIS — I10 Essential (primary) hypertension: Secondary | ICD-10-CM | POA: Diagnosis not present

## 2013-06-18 DIAGNOSIS — D6489 Other specified anemias: Secondary | ICD-10-CM | POA: Diagnosis not present

## 2013-06-18 DIAGNOSIS — I4891 Unspecified atrial fibrillation: Secondary | ICD-10-CM | POA: Diagnosis not present

## 2013-06-18 DIAGNOSIS — I491 Atrial premature depolarization: Secondary | ICD-10-CM | POA: Diagnosis not present

## 2013-06-18 DIAGNOSIS — R7989 Other specified abnormal findings of blood chemistry: Secondary | ICD-10-CM | POA: Diagnosis not present

## 2013-06-18 DIAGNOSIS — Z79899 Other long term (current) drug therapy: Secondary | ICD-10-CM | POA: Diagnosis not present

## 2013-06-18 DIAGNOSIS — E785 Hyperlipidemia, unspecified: Secondary | ICD-10-CM | POA: Diagnosis not present

## 2013-06-18 DIAGNOSIS — E669 Obesity, unspecified: Secondary | ICD-10-CM | POA: Diagnosis not present

## 2013-06-26 DIAGNOSIS — D8989 Other specified disorders involving the immune mechanism, not elsewhere classified: Secondary | ICD-10-CM | POA: Diagnosis not present

## 2013-06-26 DIAGNOSIS — Z79899 Other long term (current) drug therapy: Secondary | ICD-10-CM | POA: Diagnosis not present

## 2013-06-26 DIAGNOSIS — R7989 Other specified abnormal findings of blood chemistry: Secondary | ICD-10-CM | POA: Diagnosis not present

## 2013-06-26 DIAGNOSIS — Z940 Kidney transplant status: Secondary | ICD-10-CM | POA: Diagnosis not present

## 2013-07-02 DIAGNOSIS — N184 Chronic kidney disease, stage 4 (severe): Secondary | ICD-10-CM | POA: Diagnosis not present

## 2013-07-02 DIAGNOSIS — Z940 Kidney transplant status: Secondary | ICD-10-CM | POA: Diagnosis not present

## 2013-07-02 DIAGNOSIS — Z79899 Other long term (current) drug therapy: Secondary | ICD-10-CM | POA: Diagnosis not present

## 2013-07-02 DIAGNOSIS — R7989 Other specified abnormal findings of blood chemistry: Secondary | ICD-10-CM | POA: Diagnosis not present

## 2013-07-02 DIAGNOSIS — D8989 Other specified disorders involving the immune mechanism, not elsewhere classified: Secondary | ICD-10-CM | POA: Diagnosis not present

## 2013-07-09 DIAGNOSIS — I10 Essential (primary) hypertension: Secondary | ICD-10-CM | POA: Diagnosis not present

## 2013-07-09 DIAGNOSIS — B259 Cytomegaloviral disease, unspecified: Secondary | ICD-10-CM | POA: Diagnosis not present

## 2013-07-09 DIAGNOSIS — Z79899 Other long term (current) drug therapy: Secondary | ICD-10-CM | POA: Diagnosis not present

## 2013-07-09 DIAGNOSIS — Z940 Kidney transplant status: Secondary | ICD-10-CM | POA: Diagnosis not present

## 2013-07-09 DIAGNOSIS — E119 Type 2 diabetes mellitus without complications: Secondary | ICD-10-CM | POA: Diagnosis not present

## 2013-07-21 DIAGNOSIS — B259 Cytomegaloviral disease, unspecified: Secondary | ICD-10-CM | POA: Diagnosis not present

## 2013-07-21 DIAGNOSIS — T8610 Unspecified complication of kidney transplant: Secondary | ICD-10-CM | POA: Diagnosis not present

## 2013-07-22 DIAGNOSIS — T8610 Unspecified complication of kidney transplant: Secondary | ICD-10-CM | POA: Diagnosis not present

## 2013-07-22 DIAGNOSIS — Z79899 Other long term (current) drug therapy: Secondary | ICD-10-CM | POA: Diagnosis not present

## 2013-07-22 DIAGNOSIS — R7989 Other specified abnormal findings of blood chemistry: Secondary | ICD-10-CM | POA: Diagnosis not present

## 2013-07-22 DIAGNOSIS — Z940 Kidney transplant status: Secondary | ICD-10-CM | POA: Diagnosis not present

## 2013-07-22 DIAGNOSIS — D8989 Other specified disorders involving the immune mechanism, not elsewhere classified: Secondary | ICD-10-CM | POA: Diagnosis not present

## 2013-07-22 DIAGNOSIS — B259 Cytomegaloviral disease, unspecified: Secondary | ICD-10-CM | POA: Diagnosis not present

## 2013-07-23 ENCOUNTER — Other Ambulatory Visit: Payer: Self-pay | Admitting: Gastroenterology

## 2013-07-29 ENCOUNTER — Encounter (HOSPITAL_COMMUNITY): Payer: Self-pay | Admitting: Pharmacy Technician

## 2013-08-01 ENCOUNTER — Encounter (HOSPITAL_COMMUNITY): Payer: Self-pay | Admitting: *Deleted

## 2013-08-06 DIAGNOSIS — R7989 Other specified abnormal findings of blood chemistry: Secondary | ICD-10-CM | POA: Diagnosis not present

## 2013-08-06 DIAGNOSIS — Z940 Kidney transplant status: Secondary | ICD-10-CM | POA: Diagnosis not present

## 2013-08-06 DIAGNOSIS — D8989 Other specified disorders involving the immune mechanism, not elsewhere classified: Secondary | ICD-10-CM | POA: Diagnosis not present

## 2013-08-06 DIAGNOSIS — Z79899 Other long term (current) drug therapy: Secondary | ICD-10-CM | POA: Diagnosis not present

## 2013-08-19 DIAGNOSIS — E785 Hyperlipidemia, unspecified: Secondary | ICD-10-CM | POA: Diagnosis not present

## 2013-08-19 DIAGNOSIS — R7989 Other specified abnormal findings of blood chemistry: Secondary | ICD-10-CM | POA: Diagnosis not present

## 2013-08-19 DIAGNOSIS — N189 Chronic kidney disease, unspecified: Secondary | ICD-10-CM | POA: Diagnosis not present

## 2013-08-19 DIAGNOSIS — D709 Neutropenia, unspecified: Secondary | ICD-10-CM | POA: Diagnosis not present

## 2013-08-19 DIAGNOSIS — Z79899 Other long term (current) drug therapy: Secondary | ICD-10-CM | POA: Diagnosis not present

## 2013-08-19 DIAGNOSIS — D8989 Other specified disorders involving the immune mechanism, not elsewhere classified: Secondary | ICD-10-CM | POA: Diagnosis not present

## 2013-08-19 DIAGNOSIS — I10 Essential (primary) hypertension: Secondary | ICD-10-CM | POA: Diagnosis not present

## 2013-08-19 DIAGNOSIS — Z940 Kidney transplant status: Secondary | ICD-10-CM | POA: Diagnosis not present

## 2013-08-20 ENCOUNTER — Ambulatory Visit (HOSPITAL_COMMUNITY): Payer: Medicare Other | Admitting: Anesthesiology

## 2013-08-20 ENCOUNTER — Encounter (HOSPITAL_COMMUNITY)
Admission: RE | Disposition: A | Discharge status: Home / Self Care | Payer: Self-pay | Source: Ambulatory Visit | Attending: Gastroenterology

## 2013-08-20 ENCOUNTER — Encounter (HOSPITAL_COMMUNITY): Payer: Medicare Other | Admitting: Anesthesiology

## 2013-08-20 ENCOUNTER — Ambulatory Visit (HOSPITAL_COMMUNITY)
Admission: RE | Admit: 2013-08-20 | Discharge: 2013-08-20 | Disposition: A | Discharge status: Home / Self Care | Payer: Medicare Other | Source: Ambulatory Visit | Attending: Gastroenterology | Admitting: Gastroenterology

## 2013-08-20 ENCOUNTER — Encounter (HOSPITAL_COMMUNITY): Payer: Self-pay | Admitting: *Deleted

## 2013-08-20 DIAGNOSIS — E7800 Pure hypercholesterolemia, unspecified: Secondary | ICD-10-CM | POA: Diagnosis not present

## 2013-08-20 DIAGNOSIS — I10 Essential (primary) hypertension: Secondary | ICD-10-CM | POA: Diagnosis not present

## 2013-08-20 DIAGNOSIS — E119 Type 2 diabetes mellitus without complications: Secondary | ICD-10-CM | POA: Diagnosis not present

## 2013-08-20 DIAGNOSIS — Z940 Kidney transplant status: Secondary | ICD-10-CM | POA: Insufficient documentation

## 2013-08-20 DIAGNOSIS — Z87891 Personal history of nicotine dependence: Secondary | ICD-10-CM | POA: Insufficient documentation

## 2013-08-20 DIAGNOSIS — D126 Benign neoplasm of colon, unspecified: Secondary | ICD-10-CM | POA: Diagnosis not present

## 2013-08-20 DIAGNOSIS — Z86010 Personal history of colonic polyps: Secondary | ICD-10-CM | POA: Diagnosis not present

## 2013-08-20 DIAGNOSIS — Z1211 Encounter for screening for malignant neoplasm of colon: Secondary | ICD-10-CM | POA: Diagnosis not present

## 2013-08-20 HISTORY — DX: Type 2 diabetes mellitus without complications: E11.9

## 2013-08-20 HISTORY — PX: COLONOSCOPY WITH PROPOFOL: SHX5780

## 2013-08-20 HISTORY — DX: Gastro-esophageal reflux disease without esophagitis: K21.9

## 2013-08-20 HISTORY — DX: Chronic kidney disease, unspecified: N18.9

## 2013-08-20 HISTORY — DX: Pure hypercholesterolemia, unspecified: E78.00

## 2013-08-20 HISTORY — DX: Essential (primary) hypertension: I10

## 2013-08-20 HISTORY — DX: Cytomegaloviral disease, unspecified: B25.9

## 2013-08-20 HISTORY — DX: Unspecified osteoarthritis, unspecified site: M19.90

## 2013-08-20 LAB — GLUCOSE, CAPILLARY
Glucose-Capillary: 112 mg/dL — ABNORMAL HIGH (ref 70–99)
Glucose-Capillary: 120 mg/dL — ABNORMAL HIGH (ref 70–99)

## 2013-08-20 SURGERY — COLONOSCOPY WITH PROPOFOL
Anesthesia: Monitor Anesthesia Care

## 2013-08-20 MED ORDER — PROPOFOL 10 MG/ML IV BOLUS
INTRAVENOUS | Status: AC
  Filled 2013-08-20: qty 20

## 2013-08-20 MED ORDER — KETAMINE HCL 10 MG/ML IJ SOLN
INTRAMUSCULAR | Status: AC
  Filled 2013-08-20: qty 1

## 2013-08-20 MED ORDER — SODIUM CHLORIDE 0.9 % IV SOLN
INTRAVENOUS | Status: DC
  Administered 2013-08-20: 08:00:00 via INTRAVENOUS

## 2013-08-20 MED ORDER — MIDAZOLAM HCL 2 MG/2ML IJ SOLN
INTRAMUSCULAR | Status: AC
  Filled 2013-08-20: qty 2

## 2013-08-20 MED ORDER — MIDAZOLAM HCL 5 MG/5ML IJ SOLN
INTRAMUSCULAR | Status: DC | PRN
  Administered 2013-08-20: 2 mg via INTRAVENOUS

## 2013-08-20 MED ORDER — KETAMINE HCL 10 MG/ML IJ SOLN
INTRAMUSCULAR | Status: DC | PRN
  Administered 2013-08-20: 20 mg via INTRAVENOUS

## 2013-08-20 MED ORDER — PROPOFOL INFUSION 10 MG/ML OPTIME
INTRAVENOUS | Status: DC | PRN
  Administered 2013-08-20: 50 ug/kg/min via INTRAVENOUS

## 2013-08-20 SURGICAL SUPPLY — 21 items

## 2013-08-20 NOTE — Op Note (Signed)
Problem: Personal history of colon polyps removed colonoscopically  Endoscopist: Earle Gell  Premedication: Propofol administered by anesthesia  Procedure: Surveillance colonoscopy The patient was placed in the left lateral decubitus position. The Pentax colonoscope was introduced into the rectum and advanced to the cecum. A normal-appearing appendiceal orifice and ileocecal valve were identified. Colonic preparation for the exam today was good.  Rectum. Normal. Retroflexed view of the distal rectum normal.  Sigmoid colon and descending colon. At 50 cm from the anal verge, a polypectomy site was identified by mucosal scarring and tattoo. A 4 mm sessile polyp was removed with the cold biopsy forceps within the polypectomy site. A 3 mm sessile polyp was removed within the polypectomy site with the cold biopsy forceps.  Splenic flexure. Normal  Transverse colon. Normal. Hepatic flexure. Normal  Ascending colon. From the mid ascending colon, a 5 mm sessile polyp was removed with the cold snare.  Cecum and ileocecal valve. Normal  Assessment:  #1. At 50 cm from the anal verge, in the left colon, a large polypectomy site was identified by a mucosal scarring and tattoo. A 4 mm sessile polyp and 3 mm sessile polyp within the polypectomy site was removed.  #2. From the mid ascending colon, a 5 mm sessile polyp was removed with the cold snare  Recommendation: Schedule surveillance colonoscopy in 5 years.

## 2013-08-20 NOTE — Anesthesia Preprocedure Evaluation (Addendum)
Anesthesia Evaluation  Patient identified by MRN, date of birth, ID band Patient awake    Reviewed: Allergy & Precautions, H&P , NPO status , Patient's Chart, lab work & pertinent test results  Airway Mallampati: II TM Distance: >3 FB Neck ROM: Full    Dental no notable dental hx. (+) Missing   Pulmonary former smoker,  breath sounds clear to auscultation  Pulmonary exam normal       Cardiovascular hypertension, Pt. on medications Rhythm:Regular Rate:Normal     Neuro/Psych negative neurological ROS  negative psych ROS   GI/Hepatic Neg liver ROS, GERD-  ,  Endo/Other  diabetes  Renal/GU Renal diseaseS/p kidney transplant  negative genitourinary   Musculoskeletal negative musculoskeletal ROS (+)   Abdominal   Peds negative pediatric ROS (+)  Hematology negative hematology ROS (+)   Anesthesia Other Findings   Reproductive/Obstetrics negative OB ROS                          Anesthesia Physical Anesthesia Plan  ASA: III  Anesthesia Plan: MAC   Post-op Pain Management:    Induction:   Airway Management Planned:   Additional Equipment:   Intra-op Plan:   Post-operative Plan:   Informed Consent: I have reviewed the patients History and Physical, chart, labs and discussed the procedure including the risks, benefits and alternatives for the proposed anesthesia with the patient or authorized representative who has indicated his/her understanding and acceptance.   Dental advisory given  Plan Discussed with: CRNA  Anesthesia Plan Comments:         Anesthesia Quick Evaluation

## 2013-08-20 NOTE — H&P (Signed)
Procedure: Surveillance colonoscopy  History: The patient is a 68 year old female born 11-04-1945. On 08/20/2006 the patient underwent a colonoscopy with removal of a colon polyp in the [place], [place]. I do not have a colonoscopy report or polyp pathology report. The patient underwent a kidney transplant last year.  The patient is scheduled to undergo a surveillance colonoscopy today.  Medication allergies: None  Past medical and surgical history: Kidney transplant in 2013. Hysterectomy and oophorectomy for fibroid tumors in 1993. Cholecystectomy in 1970. Type 2 diabetes mellitus. Hypercholesterolemia. Hypertension. Vitamin D deficiency. Renal osteodystrophy. Nephrolithiasis. Anemia of chronic disease.  Family history: Negative for colon cancer.  Habits: The patient does not consume alcohol. She quit smoking cigarettes in 1988.  Exam: The patient is alert and lying comfortably on the endoscopy stretcher. Cardiac exam reveals a regular rhythm. Lungs are clear to auscultation. Abdomen is soft and nontender to palpation.  Plan: Proceed with surveillance colonoscopy.

## 2013-08-20 NOTE — Preoperative (Signed)
Beta Blockers   Reason not to administer Beta Blockers:Patient took metoprolol this am 08/20/13

## 2013-08-20 NOTE — Anesthesia Postprocedure Evaluation (Signed)
  Anesthesia Post-op Note  Patient: Andrea Coleman  Procedure(s) Performed: Procedure(s) (LRB): COLONOSCOPY WITH PROPOFOL (N/A)  Patient Location: PACU  Anesthesia Type: MAC  Level of Consciousness: awake and alert   Airway and Oxygen Therapy: Patient Spontanous Breathing  Post-op Pain: mild  Post-op Assessment: Post-op Vital signs reviewed, Patient's Cardiovascular Status Stable, Respiratory Function Stable, Patent Airway and No signs of Nausea or vomiting  Last Vitals:  Filed Vitals:   08/20/13 0910  BP: 141/88  Pulse:   Temp:   Resp: 15    Post-op Vital Signs: stable   Complications: No apparent anesthesia complications

## 2013-08-20 NOTE — Discharge Instructions (Signed)
Colonoscopy  Care After  These instructions give you information on caring for yourself after your procedure. Your doctor may also give you more specific instructions. Call your doctor if you have any problems or questions after your procedure.  HOME CARE  · Take it easy for the next 24 hours.  · Rest.  · Walk or use warm packs on your belly (abdomen) if you have belly cramping or gas.  · Do not drive for 24 hours.  · You may shower.  · Do not sign important papers or use machinery for 24 hours.  · Drink enough fluids to keep your pee (urine) clear or pale yellow.  · Resume your normal diet. Avoid heavy or fried foods.  · Avoid alcohol.  · Continue taking your normal medicines.  · Only take medicine as told by your doctor. Do not take aspirin.  If you had growths (polyps) removed:  · Do not take aspirin.  · Do not drink alcohol for 7 days or as told by your doctor.  · Eat a soft diet for 24 hours.  GET HELP RIGHT AWAY IF:  · You have a fever.  · You pass clumps of tissue (blood clots) or fill the toilet with blood.  · You have belly pain that gets worse and medicine does not help.  · Your belly is puffy (swollen).  · You feel sick to your stomach (nauseous) or throw up (vomit).  MAKE SURE YOU:  · Understand these instructions.  · Will watch your condition.  · Will get help right away if you are not doing well or get worse.  Document Released: 09/03/2010 Document Revised: 10/24/2011 Document Reviewed: 09/03/2010  Exit[employer]® Patient Information ©2014 [employer].

## 2013-08-20 NOTE — Transfer of Care (Signed)
Immediate Anesthesia Transfer of Care Note  Patient: Andrea Coleman  Procedure(s) Performed: Procedure(s): COLONOSCOPY WITH PROPOFOL (N/A)  Patient Location: PACU  Anesthesia Type:MAC  Level of Consciousness: sedated  Airway & Oxygen Therapy: Patient Spontanous Breathing and Patient connected to face mask oxygen  Post-op Assessment: Report given to PACU RN and Post -op Vital signs reviewed and stable  Post vital signs: Reviewed and stable  Complications: No apparent anesthesia complications

## 2013-08-21 ENCOUNTER — Encounter (HOSPITAL_COMMUNITY): Payer: Self-pay | Admitting: Gastroenterology

## 2013-08-21 DIAGNOSIS — Z940 Kidney transplant status: Secondary | ICD-10-CM | POA: Diagnosis not present

## 2013-08-21 DIAGNOSIS — E785 Hyperlipidemia, unspecified: Secondary | ICD-10-CM | POA: Diagnosis not present

## 2013-08-21 DIAGNOSIS — E139 Other specified diabetes mellitus without complications: Secondary | ICD-10-CM | POA: Diagnosis not present

## 2013-08-22 DIAGNOSIS — Z940 Kidney transplant status: Secondary | ICD-10-CM | POA: Diagnosis not present

## 2013-08-22 DIAGNOSIS — B259 Cytomegaloviral disease, unspecified: Secondary | ICD-10-CM | POA: Diagnosis not present

## 2013-08-22 DIAGNOSIS — E559 Vitamin D deficiency, unspecified: Secondary | ICD-10-CM | POA: Diagnosis not present

## 2013-08-22 DIAGNOSIS — E785 Hyperlipidemia, unspecified: Secondary | ICD-10-CM | POA: Diagnosis not present

## 2013-08-22 DIAGNOSIS — E139 Other specified diabetes mellitus without complications: Secondary | ICD-10-CM | POA: Diagnosis not present

## 2013-08-22 DIAGNOSIS — I10 Essential (primary) hypertension: Secondary | ICD-10-CM | POA: Diagnosis not present

## 2013-08-27 DIAGNOSIS — Z940 Kidney transplant status: Secondary | ICD-10-CM | POA: Diagnosis not present

## 2013-08-27 DIAGNOSIS — B259 Cytomegaloviral disease, unspecified: Secondary | ICD-10-CM | POA: Diagnosis not present

## 2013-08-27 DIAGNOSIS — I10 Essential (primary) hypertension: Secondary | ICD-10-CM | POA: Diagnosis not present

## 2013-08-27 DIAGNOSIS — E119 Type 2 diabetes mellitus without complications: Secondary | ICD-10-CM | POA: Diagnosis not present

## 2013-08-27 DIAGNOSIS — T8610 Unspecified complication of kidney transplant: Secondary | ICD-10-CM | POA: Diagnosis not present

## 2013-08-27 DIAGNOSIS — E669 Obesity, unspecified: Secondary | ICD-10-CM | POA: Diagnosis not present

## 2013-08-27 DIAGNOSIS — Z48298 Encounter for aftercare following other organ transplant: Secondary | ICD-10-CM | POA: Diagnosis not present

## 2013-08-27 DIAGNOSIS — Z79899 Other long term (current) drug therapy: Secondary | ICD-10-CM | POA: Diagnosis not present

## 2013-09-03 DIAGNOSIS — R7989 Other specified abnormal findings of blood chemistry: Secondary | ICD-10-CM | POA: Diagnosis not present

## 2013-09-03 DIAGNOSIS — Z940 Kidney transplant status: Secondary | ICD-10-CM | POA: Diagnosis not present

## 2013-09-03 DIAGNOSIS — Z79899 Other long term (current) drug therapy: Secondary | ICD-10-CM | POA: Diagnosis not present

## 2013-09-03 DIAGNOSIS — D8989 Other specified disorders involving the immune mechanism, not elsewhere classified: Secondary | ICD-10-CM | POA: Diagnosis not present

## 2013-09-10 DIAGNOSIS — R7989 Other specified abnormal findings of blood chemistry: Secondary | ICD-10-CM | POA: Diagnosis not present

## 2013-09-10 DIAGNOSIS — E785 Hyperlipidemia, unspecified: Secondary | ICD-10-CM | POA: Diagnosis not present

## 2013-09-10 DIAGNOSIS — N189 Chronic kidney disease, unspecified: Secondary | ICD-10-CM | POA: Diagnosis not present

## 2013-09-10 DIAGNOSIS — I10 Essential (primary) hypertension: Secondary | ICD-10-CM | POA: Diagnosis not present

## 2013-09-10 DIAGNOSIS — Z79899 Other long term (current) drug therapy: Secondary | ICD-10-CM | POA: Diagnosis not present

## 2013-09-10 DIAGNOSIS — D8989 Other specified disorders involving the immune mechanism, not elsewhere classified: Secondary | ICD-10-CM | POA: Diagnosis not present

## 2013-09-10 DIAGNOSIS — Z940 Kidney transplant status: Secondary | ICD-10-CM | POA: Diagnosis not present

## 2013-09-17 DIAGNOSIS — Z940 Kidney transplant status: Secondary | ICD-10-CM | POA: Diagnosis not present

## 2013-09-17 DIAGNOSIS — R7989 Other specified abnormal findings of blood chemistry: Secondary | ICD-10-CM | POA: Diagnosis not present

## 2013-09-17 DIAGNOSIS — D8989 Other specified disorders involving the immune mechanism, not elsewhere classified: Secondary | ICD-10-CM | POA: Diagnosis not present

## 2013-09-17 DIAGNOSIS — Z79899 Other long term (current) drug therapy: Secondary | ICD-10-CM | POA: Diagnosis not present

## 2013-09-24 DIAGNOSIS — I10 Essential (primary) hypertension: Secondary | ICD-10-CM | POA: Diagnosis not present

## 2013-09-24 DIAGNOSIS — B259 Cytomegaloviral disease, unspecified: Secondary | ICD-10-CM | POA: Diagnosis not present

## 2013-09-24 DIAGNOSIS — Z48298 Encounter for aftercare following other organ transplant: Secondary | ICD-10-CM | POA: Diagnosis not present

## 2013-09-24 DIAGNOSIS — Z940 Kidney transplant status: Secondary | ICD-10-CM | POA: Diagnosis not present

## 2013-09-24 DIAGNOSIS — Z79899 Other long term (current) drug therapy: Secondary | ICD-10-CM | POA: Diagnosis not present

## 2013-09-24 DIAGNOSIS — T8610 Unspecified complication of kidney transplant: Secondary | ICD-10-CM | POA: Diagnosis not present

## 2013-09-24 DIAGNOSIS — E669 Obesity, unspecified: Secondary | ICD-10-CM | POA: Diagnosis not present

## 2013-10-08 DIAGNOSIS — D8989 Other specified disorders involving the immune mechanism, not elsewhere classified: Secondary | ICD-10-CM | POA: Diagnosis not present

## 2013-10-08 DIAGNOSIS — R7989 Other specified abnormal findings of blood chemistry: Secondary | ICD-10-CM | POA: Diagnosis not present

## 2013-10-08 DIAGNOSIS — Z79899 Other long term (current) drug therapy: Secondary | ICD-10-CM | POA: Diagnosis not present

## 2013-10-08 DIAGNOSIS — Z940 Kidney transplant status: Secondary | ICD-10-CM | POA: Diagnosis not present

## 2013-10-21 DIAGNOSIS — Z940 Kidney transplant status: Secondary | ICD-10-CM | POA: Diagnosis not present

## 2013-10-21 DIAGNOSIS — I10 Essential (primary) hypertension: Secondary | ICD-10-CM | POA: Diagnosis not present

## 2013-10-21 DIAGNOSIS — R609 Edema, unspecified: Secondary | ICD-10-CM | POA: Diagnosis not present

## 2013-10-21 DIAGNOSIS — M25569 Pain in unspecified knee: Secondary | ICD-10-CM | POA: Diagnosis not present

## 2013-10-22 DIAGNOSIS — Z940 Kidney transplant status: Secondary | ICD-10-CM | POA: Diagnosis not present

## 2013-10-22 DIAGNOSIS — Z79899 Other long term (current) drug therapy: Secondary | ICD-10-CM | POA: Diagnosis not present

## 2013-10-22 DIAGNOSIS — D8989 Other specified disorders involving the immune mechanism, not elsewhere classified: Secondary | ICD-10-CM | POA: Diagnosis not present

## 2013-10-22 DIAGNOSIS — R7989 Other specified abnormal findings of blood chemistry: Secondary | ICD-10-CM | POA: Diagnosis not present

## 2013-11-05 DIAGNOSIS — Z940 Kidney transplant status: Secondary | ICD-10-CM | POA: Diagnosis not present

## 2013-11-05 DIAGNOSIS — R7989 Other specified abnormal findings of blood chemistry: Secondary | ICD-10-CM | POA: Diagnosis not present

## 2013-11-05 DIAGNOSIS — Z79899 Other long term (current) drug therapy: Secondary | ICD-10-CM | POA: Diagnosis not present

## 2013-11-05 DIAGNOSIS — D8989 Other specified disorders involving the immune mechanism, not elsewhere classified: Secondary | ICD-10-CM | POA: Diagnosis not present

## 2013-11-26 DIAGNOSIS — B259 Cytomegaloviral disease, unspecified: Secondary | ICD-10-CM | POA: Diagnosis not present

## 2013-11-26 DIAGNOSIS — E785 Hyperlipidemia, unspecified: Secondary | ICD-10-CM | POA: Diagnosis not present

## 2013-11-26 DIAGNOSIS — Z79899 Other long term (current) drug therapy: Secondary | ICD-10-CM | POA: Diagnosis not present

## 2013-11-26 DIAGNOSIS — T8610 Unspecified complication of kidney transplant: Secondary | ICD-10-CM | POA: Diagnosis not present

## 2013-11-26 DIAGNOSIS — D8989 Other specified disorders involving the immune mechanism, not elsewhere classified: Secondary | ICD-10-CM | POA: Diagnosis not present

## 2013-11-26 DIAGNOSIS — R7989 Other specified abnormal findings of blood chemistry: Secondary | ICD-10-CM | POA: Diagnosis not present

## 2013-11-26 DIAGNOSIS — Z940 Kidney transplant status: Secondary | ICD-10-CM | POA: Diagnosis not present

## 2013-11-26 DIAGNOSIS — E139 Other specified diabetes mellitus without complications: Secondary | ICD-10-CM | POA: Diagnosis not present

## 2013-12-03 DIAGNOSIS — E785 Hyperlipidemia, unspecified: Secondary | ICD-10-CM | POA: Diagnosis not present

## 2013-12-03 DIAGNOSIS — B259 Cytomegaloviral disease, unspecified: Secondary | ICD-10-CM | POA: Diagnosis not present

## 2013-12-03 DIAGNOSIS — E559 Vitamin D deficiency, unspecified: Secondary | ICD-10-CM | POA: Diagnosis not present

## 2013-12-03 DIAGNOSIS — I10 Essential (primary) hypertension: Secondary | ICD-10-CM | POA: Diagnosis not present

## 2013-12-03 DIAGNOSIS — E139 Other specified diabetes mellitus without complications: Secondary | ICD-10-CM | POA: Diagnosis not present

## 2013-12-03 DIAGNOSIS — Z940 Kidney transplant status: Secondary | ICD-10-CM | POA: Diagnosis not present

## 2013-12-10 DIAGNOSIS — T8610 Unspecified complication of kidney transplant: Secondary | ICD-10-CM | POA: Diagnosis not present

## 2013-12-10 DIAGNOSIS — B259 Cytomegaloviral disease, unspecified: Secondary | ICD-10-CM | POA: Diagnosis not present

## 2013-12-10 DIAGNOSIS — Z940 Kidney transplant status: Secondary | ICD-10-CM | POA: Diagnosis not present

## 2013-12-10 DIAGNOSIS — D8989 Other specified disorders involving the immune mechanism, not elsewhere classified: Secondary | ICD-10-CM | POA: Diagnosis not present

## 2013-12-10 DIAGNOSIS — Z79899 Other long term (current) drug therapy: Secondary | ICD-10-CM | POA: Diagnosis not present

## 2013-12-10 DIAGNOSIS — R7989 Other specified abnormal findings of blood chemistry: Secondary | ICD-10-CM | POA: Diagnosis not present

## 2013-12-17 DIAGNOSIS — Z940 Kidney transplant status: Secondary | ICD-10-CM | POA: Diagnosis not present

## 2013-12-17 DIAGNOSIS — I10 Essential (primary) hypertension: Secondary | ICD-10-CM | POA: Diagnosis not present

## 2013-12-17 DIAGNOSIS — Z79899 Other long term (current) drug therapy: Secondary | ICD-10-CM | POA: Diagnosis not present

## 2013-12-17 DIAGNOSIS — E669 Obesity, unspecified: Secondary | ICD-10-CM | POA: Diagnosis not present

## 2013-12-17 DIAGNOSIS — J309 Allergic rhinitis, unspecified: Secondary | ICD-10-CM | POA: Diagnosis not present

## 2014-01-14 DIAGNOSIS — Z940 Kidney transplant status: Secondary | ICD-10-CM | POA: Diagnosis not present

## 2014-01-14 DIAGNOSIS — R7989 Other specified abnormal findings of blood chemistry: Secondary | ICD-10-CM | POA: Diagnosis not present

## 2014-01-14 DIAGNOSIS — Z79899 Other long term (current) drug therapy: Secondary | ICD-10-CM | POA: Diagnosis not present

## 2014-01-14 DIAGNOSIS — D8989 Other specified disorders involving the immune mechanism, not elsewhere classified: Secondary | ICD-10-CM | POA: Diagnosis not present

## 2014-01-14 DIAGNOSIS — B259 Cytomegaloviral disease, unspecified: Secondary | ICD-10-CM | POA: Diagnosis not present

## 2014-01-14 DIAGNOSIS — T8610 Unspecified complication of kidney transplant: Secondary | ICD-10-CM | POA: Diagnosis not present

## 2014-01-31 DIAGNOSIS — M25569 Pain in unspecified knee: Secondary | ICD-10-CM | POA: Diagnosis not present

## 2014-01-31 DIAGNOSIS — Z0000 Encounter for general adult medical examination without abnormal findings: Secondary | ICD-10-CM | POA: Diagnosis not present

## 2014-01-31 DIAGNOSIS — Z1331 Encounter for screening for depression: Secondary | ICD-10-CM | POA: Diagnosis not present

## 2014-01-31 DIAGNOSIS — E139 Other specified diabetes mellitus without complications: Secondary | ICD-10-CM | POA: Diagnosis not present

## 2014-01-31 DIAGNOSIS — E785 Hyperlipidemia, unspecified: Secondary | ICD-10-CM | POA: Diagnosis not present

## 2014-01-31 DIAGNOSIS — I10 Essential (primary) hypertension: Secondary | ICD-10-CM | POA: Diagnosis not present

## 2014-02-04 ENCOUNTER — Other Ambulatory Visit: Payer: Self-pay | Admitting: Orthopedic Surgery

## 2014-02-04 ENCOUNTER — Other Ambulatory Visit: Payer: Self-pay

## 2014-02-04 DIAGNOSIS — M25569 Pain in unspecified knee: Secondary | ICD-10-CM | POA: Diagnosis not present

## 2014-02-04 DIAGNOSIS — D8989 Other specified disorders involving the immune mechanism, not elsewhere classified: Secondary | ICD-10-CM | POA: Diagnosis not present

## 2014-02-04 DIAGNOSIS — Z79899 Other long term (current) drug therapy: Secondary | ICD-10-CM | POA: Diagnosis not present

## 2014-02-04 DIAGNOSIS — Z940 Kidney transplant status: Secondary | ICD-10-CM | POA: Diagnosis not present

## 2014-02-04 DIAGNOSIS — R7989 Other specified abnormal findings of blood chemistry: Secondary | ICD-10-CM | POA: Diagnosis not present

## 2014-02-04 DIAGNOSIS — Z1231 Encounter for screening mammogram for malignant neoplasm of breast: Secondary | ICD-10-CM

## 2014-02-04 DIAGNOSIS — B259 Cytomegaloviral disease, unspecified: Secondary | ICD-10-CM | POA: Diagnosis not present

## 2014-02-04 DIAGNOSIS — M25562 Pain in left knee: Secondary | ICD-10-CM

## 2014-02-04 DIAGNOSIS — T8610 Unspecified complication of kidney transplant: Secondary | ICD-10-CM | POA: Diagnosis not present

## 2014-02-07 ENCOUNTER — Ambulatory Visit
Admission: RE | Admit: 2014-02-07 | Discharge: 2014-02-07 | Disposition: A | Discharge status: Home / Self Care | Payer: Medicare Other | Source: Ambulatory Visit

## 2014-02-07 ENCOUNTER — Encounter (INDEPENDENT_AMBULATORY_CARE_PROVIDER_SITE_OTHER): Payer: Self-pay

## 2014-02-07 DIAGNOSIS — Z1231 Encounter for screening mammogram for malignant neoplasm of breast: Secondary | ICD-10-CM | POA: Diagnosis not present

## 2014-02-16 ENCOUNTER — Ambulatory Visit
Admission: RE | Admit: 2014-02-16 | Discharge: 2014-02-16 | Disposition: A | Discharge status: Home / Self Care | Payer: Medicare Other | Source: Ambulatory Visit | Attending: Orthopedic Surgery | Admitting: Orthopedic Surgery

## 2014-02-16 DIAGNOSIS — M25562 Pain in left knee: Secondary | ICD-10-CM

## 2014-02-16 DIAGNOSIS — M25569 Pain in unspecified knee: Secondary | ICD-10-CM | POA: Diagnosis not present

## 2014-02-19 DIAGNOSIS — M25569 Pain in unspecified knee: Secondary | ICD-10-CM | POA: Diagnosis not present

## 2014-02-25 DIAGNOSIS — Z79899 Other long term (current) drug therapy: Secondary | ICD-10-CM | POA: Diagnosis not present

## 2014-02-25 DIAGNOSIS — R7989 Other specified abnormal findings of blood chemistry: Secondary | ICD-10-CM | POA: Diagnosis not present

## 2014-02-25 DIAGNOSIS — Z940 Kidney transplant status: Secondary | ICD-10-CM | POA: Diagnosis not present

## 2014-02-25 DIAGNOSIS — D8989 Other specified disorders involving the immune mechanism, not elsewhere classified: Secondary | ICD-10-CM | POA: Diagnosis not present

## 2014-03-03 DIAGNOSIS — E139 Other specified diabetes mellitus without complications: Secondary | ICD-10-CM | POA: Diagnosis not present

## 2014-03-03 DIAGNOSIS — E785 Hyperlipidemia, unspecified: Secondary | ICD-10-CM | POA: Diagnosis not present

## 2014-03-06 DIAGNOSIS — E559 Vitamin D deficiency, unspecified: Secondary | ICD-10-CM | POA: Diagnosis not present

## 2014-03-06 DIAGNOSIS — B259 Cytomegaloviral disease, unspecified: Secondary | ICD-10-CM | POA: Diagnosis not present

## 2014-03-06 DIAGNOSIS — Z940 Kidney transplant status: Secondary | ICD-10-CM | POA: Diagnosis not present

## 2014-03-06 DIAGNOSIS — I10 Essential (primary) hypertension: Secondary | ICD-10-CM | POA: Diagnosis not present

## 2014-03-06 DIAGNOSIS — E785 Hyperlipidemia, unspecified: Secondary | ICD-10-CM | POA: Diagnosis not present

## 2014-03-06 DIAGNOSIS — E139 Other specified diabetes mellitus without complications: Secondary | ICD-10-CM | POA: Diagnosis not present

## 2014-03-19 DIAGNOSIS — R7989 Other specified abnormal findings of blood chemistry: Secondary | ICD-10-CM | POA: Diagnosis not present

## 2014-03-19 DIAGNOSIS — Z940 Kidney transplant status: Secondary | ICD-10-CM | POA: Diagnosis not present

## 2014-03-19 DIAGNOSIS — D8989 Other specified disorders involving the immune mechanism, not elsewhere classified: Secondary | ICD-10-CM | POA: Diagnosis not present

## 2014-03-19 DIAGNOSIS — Z79899 Other long term (current) drug therapy: Secondary | ICD-10-CM | POA: Diagnosis not present

## 2014-03-28 DIAGNOSIS — N183 Chronic kidney disease, stage 3 unspecified: Secondary | ICD-10-CM | POA: Diagnosis not present

## 2014-04-22 DIAGNOSIS — Z79899 Other long term (current) drug therapy: Secondary | ICD-10-CM | POA: Diagnosis not present

## 2014-04-22 DIAGNOSIS — Z940 Kidney transplant status: Secondary | ICD-10-CM | POA: Diagnosis not present

## 2014-04-22 DIAGNOSIS — D8989 Other specified disorders involving the immune mechanism, not elsewhere classified: Secondary | ICD-10-CM | POA: Diagnosis not present

## 2014-04-22 DIAGNOSIS — R7989 Other specified abnormal findings of blood chemistry: Secondary | ICD-10-CM | POA: Diagnosis not present

## 2014-04-23 DIAGNOSIS — Z940 Kidney transplant status: Secondary | ICD-10-CM | POA: Diagnosis not present

## 2014-04-23 DIAGNOSIS — Z79899 Other long term (current) drug therapy: Secondary | ICD-10-CM | POA: Diagnosis not present

## 2014-04-23 DIAGNOSIS — D8989 Other specified disorders involving the immune mechanism, not elsewhere classified: Secondary | ICD-10-CM | POA: Diagnosis not present

## 2014-04-23 DIAGNOSIS — R7989 Other specified abnormal findings of blood chemistry: Secondary | ICD-10-CM | POA: Diagnosis not present

## 2014-05-06 DIAGNOSIS — Z940 Kidney transplant status: Secondary | ICD-10-CM | POA: Diagnosis not present

## 2014-05-06 DIAGNOSIS — R7989 Other specified abnormal findings of blood chemistry: Secondary | ICD-10-CM | POA: Diagnosis not present

## 2014-05-06 DIAGNOSIS — Z79899 Other long term (current) drug therapy: Secondary | ICD-10-CM | POA: Diagnosis not present

## 2014-05-08 DIAGNOSIS — R7989 Other specified abnormal findings of blood chemistry: Secondary | ICD-10-CM | POA: Diagnosis not present

## 2014-05-08 DIAGNOSIS — D8989 Other specified disorders involving the immune mechanism, not elsewhere classified: Secondary | ICD-10-CM | POA: Diagnosis not present

## 2014-05-08 DIAGNOSIS — Z79899 Other long term (current) drug therapy: Secondary | ICD-10-CM | POA: Diagnosis not present

## 2014-05-08 DIAGNOSIS — Z940 Kidney transplant status: Secondary | ICD-10-CM | POA: Diagnosis not present

## 2014-05-13 DIAGNOSIS — R7989 Other specified abnormal findings of blood chemistry: Secondary | ICD-10-CM | POA: Diagnosis not present

## 2014-05-13 DIAGNOSIS — D8989 Other specified disorders involving the immune mechanism, not elsewhere classified: Secondary | ICD-10-CM | POA: Diagnosis not present

## 2014-05-13 DIAGNOSIS — Z940 Kidney transplant status: Secondary | ICD-10-CM | POA: Diagnosis not present

## 2014-05-13 DIAGNOSIS — Z79899 Other long term (current) drug therapy: Secondary | ICD-10-CM | POA: Diagnosis not present

## 2014-05-20 DIAGNOSIS — Z940 Kidney transplant status: Secondary | ICD-10-CM | POA: Diagnosis not present

## 2014-05-20 DIAGNOSIS — R7989 Other specified abnormal findings of blood chemistry: Secondary | ICD-10-CM | POA: Diagnosis not present

## 2014-05-20 DIAGNOSIS — Z79899 Other long term (current) drug therapy: Secondary | ICD-10-CM | POA: Diagnosis not present

## 2014-05-21 DIAGNOSIS — N289 Disorder of kidney and ureter, unspecified: Secondary | ICD-10-CM | POA: Diagnosis not present

## 2014-05-21 DIAGNOSIS — Z940 Kidney transplant status: Secondary | ICD-10-CM | POA: Diagnosis not present

## 2014-05-27 DIAGNOSIS — R7989 Other specified abnormal findings of blood chemistry: Secondary | ICD-10-CM | POA: Diagnosis not present

## 2014-05-27 DIAGNOSIS — Z79899 Other long term (current) drug therapy: Secondary | ICD-10-CM | POA: Diagnosis not present

## 2014-05-27 DIAGNOSIS — Z940 Kidney transplant status: Secondary | ICD-10-CM | POA: Diagnosis not present

## 2014-05-28 DIAGNOSIS — I10 Essential (primary) hypertension: Secondary | ICD-10-CM | POA: Diagnosis not present

## 2014-06-03 DIAGNOSIS — Z79899 Other long term (current) drug therapy: Secondary | ICD-10-CM | POA: Diagnosis not present

## 2014-06-03 DIAGNOSIS — R7989 Other specified abnormal findings of blood chemistry: Secondary | ICD-10-CM | POA: Diagnosis not present

## 2014-06-03 DIAGNOSIS — Z940 Kidney transplant status: Secondary | ICD-10-CM | POA: Diagnosis not present

## 2014-06-05 DIAGNOSIS — Z79899 Other long term (current) drug therapy: Secondary | ICD-10-CM | POA: Diagnosis not present

## 2014-06-06 DIAGNOSIS — E099 Drug or chemical induced diabetes mellitus without complications: Secondary | ICD-10-CM | POA: Diagnosis not present

## 2014-06-06 DIAGNOSIS — E785 Hyperlipidemia, unspecified: Secondary | ICD-10-CM | POA: Diagnosis not present

## 2014-06-09 DIAGNOSIS — E119 Type 2 diabetes mellitus without complications: Secondary | ICD-10-CM | POA: Diagnosis not present

## 2014-06-09 DIAGNOSIS — Z23 Encounter for immunization: Secondary | ICD-10-CM | POA: Diagnosis not present

## 2014-06-09 DIAGNOSIS — I10 Essential (primary) hypertension: Secondary | ICD-10-CM | POA: Diagnosis not present

## 2014-06-09 DIAGNOSIS — B259 Cytomegaloviral disease, unspecified: Secondary | ICD-10-CM | POA: Diagnosis not present

## 2014-06-09 DIAGNOSIS — Z940 Kidney transplant status: Secondary | ICD-10-CM | POA: Diagnosis not present

## 2014-06-09 DIAGNOSIS — Z79899 Other long term (current) drug therapy: Secondary | ICD-10-CM | POA: Diagnosis not present

## 2014-06-09 DIAGNOSIS — D631 Anemia in chronic kidney disease: Secondary | ICD-10-CM | POA: Diagnosis not present

## 2014-06-09 DIAGNOSIS — Z418 Encounter for other procedures for purposes other than remedying health state: Secondary | ICD-10-CM | POA: Diagnosis not present

## 2014-06-09 DIAGNOSIS — N184 Chronic kidney disease, stage 4 (severe): Secondary | ICD-10-CM | POA: Diagnosis not present

## 2014-06-10 DIAGNOSIS — E099 Drug or chemical induced diabetes mellitus without complications: Secondary | ICD-10-CM | POA: Diagnosis not present

## 2014-06-10 DIAGNOSIS — E782 Mixed hyperlipidemia: Secondary | ICD-10-CM | POA: Diagnosis not present

## 2014-06-10 DIAGNOSIS — E559 Vitamin D deficiency, unspecified: Secondary | ICD-10-CM | POA: Diagnosis not present

## 2014-06-10 DIAGNOSIS — I10 Essential (primary) hypertension: Secondary | ICD-10-CM | POA: Diagnosis not present

## 2014-06-10 DIAGNOSIS — Z940 Kidney transplant status: Secondary | ICD-10-CM | POA: Diagnosis not present

## 2014-06-24 DIAGNOSIS — I491 Atrial premature depolarization: Secondary | ICD-10-CM | POA: Diagnosis not present

## 2014-06-24 DIAGNOSIS — Z940 Kidney transplant status: Secondary | ICD-10-CM | POA: Diagnosis not present

## 2014-06-24 DIAGNOSIS — E668 Other obesity: Secondary | ICD-10-CM | POA: Diagnosis not present

## 2014-06-24 DIAGNOSIS — Z79899 Other long term (current) drug therapy: Secondary | ICD-10-CM | POA: Diagnosis not present

## 2014-06-24 DIAGNOSIS — I10 Essential (primary) hypertension: Secondary | ICD-10-CM | POA: Diagnosis not present

## 2014-06-24 DIAGNOSIS — R7989 Other specified abnormal findings of blood chemistry: Secondary | ICD-10-CM | POA: Diagnosis not present

## 2014-06-24 DIAGNOSIS — I480 Paroxysmal atrial fibrillation: Secondary | ICD-10-CM | POA: Diagnosis not present

## 2014-06-25 DIAGNOSIS — I10 Essential (primary) hypertension: Secondary | ICD-10-CM | POA: Diagnosis not present

## 2014-07-01 DIAGNOSIS — Z940 Kidney transplant status: Secondary | ICD-10-CM | POA: Diagnosis not present

## 2014-07-01 DIAGNOSIS — Z79899 Other long term (current) drug therapy: Secondary | ICD-10-CM | POA: Diagnosis not present

## 2014-07-01 DIAGNOSIS — R7989 Other specified abnormal findings of blood chemistry: Secondary | ICD-10-CM | POA: Diagnosis not present

## 2014-07-02 DIAGNOSIS — R7889 Finding of other specified substances, not normally found in blood: Secondary | ICD-10-CM | POA: Diagnosis not present

## 2014-07-02 DIAGNOSIS — D841 Defects in the complement system: Secondary | ICD-10-CM | POA: Diagnosis not present

## 2014-07-02 DIAGNOSIS — Z940 Kidney transplant status: Secondary | ICD-10-CM | POA: Diagnosis not present

## 2014-07-02 DIAGNOSIS — Z79899 Other long term (current) drug therapy: Secondary | ICD-10-CM | POA: Diagnosis not present

## 2014-07-08 DIAGNOSIS — R7989 Other specified abnormal findings of blood chemistry: Secondary | ICD-10-CM | POA: Diagnosis not present

## 2014-07-08 DIAGNOSIS — Z79899 Other long term (current) drug therapy: Secondary | ICD-10-CM | POA: Diagnosis not present

## 2014-07-08 DIAGNOSIS — Z940 Kidney transplant status: Secondary | ICD-10-CM | POA: Diagnosis not present

## 2014-07-22 DIAGNOSIS — Z79899 Other long term (current) drug therapy: Secondary | ICD-10-CM | POA: Diagnosis not present

## 2014-07-22 DIAGNOSIS — R7989 Other specified abnormal findings of blood chemistry: Secondary | ICD-10-CM | POA: Diagnosis not present

## 2014-07-22 DIAGNOSIS — Z940 Kidney transplant status: Secondary | ICD-10-CM | POA: Diagnosis not present

## 2014-07-29 DIAGNOSIS — Z79899 Other long term (current) drug therapy: Secondary | ICD-10-CM | POA: Diagnosis not present

## 2014-07-29 DIAGNOSIS — Z940 Kidney transplant status: Secondary | ICD-10-CM | POA: Diagnosis not present

## 2014-07-29 DIAGNOSIS — R7989 Other specified abnormal findings of blood chemistry: Secondary | ICD-10-CM | POA: Diagnosis not present

## 2014-08-19 DIAGNOSIS — Z79899 Other long term (current) drug therapy: Secondary | ICD-10-CM | POA: Diagnosis not present

## 2014-08-19 DIAGNOSIS — R7989 Other specified abnormal findings of blood chemistry: Secondary | ICD-10-CM | POA: Diagnosis not present

## 2014-08-19 DIAGNOSIS — Z940 Kidney transplant status: Secondary | ICD-10-CM | POA: Diagnosis not present

## 2014-08-27 DIAGNOSIS — B259 Cytomegaloviral disease, unspecified: Secondary | ICD-10-CM | POA: Diagnosis not present

## 2014-08-27 DIAGNOSIS — E099 Drug or chemical induced diabetes mellitus without complications: Secondary | ICD-10-CM | POA: Diagnosis not present

## 2014-08-27 DIAGNOSIS — I10 Essential (primary) hypertension: Secondary | ICD-10-CM | POA: Diagnosis not present

## 2014-08-27 DIAGNOSIS — Z940 Kidney transplant status: Secondary | ICD-10-CM | POA: Diagnosis not present

## 2014-08-27 DIAGNOSIS — E785 Hyperlipidemia, unspecified: Secondary | ICD-10-CM | POA: Diagnosis not present

## 2014-08-27 DIAGNOSIS — E559 Vitamin D deficiency, unspecified: Secondary | ICD-10-CM | POA: Diagnosis not present

## 2014-09-15 DIAGNOSIS — S83242A Other tear of medial meniscus, current injury, left knee, initial encounter: Secondary | ICD-10-CM | POA: Diagnosis not present

## 2014-09-18 DIAGNOSIS — E1129 Type 2 diabetes mellitus with other diabetic kidney complication: Secondary | ICD-10-CM | POA: Diagnosis not present

## 2014-09-18 DIAGNOSIS — Z940 Kidney transplant status: Secondary | ICD-10-CM | POA: Diagnosis not present

## 2014-10-21 DIAGNOSIS — E782 Mixed hyperlipidemia: Secondary | ICD-10-CM | POA: Diagnosis not present

## 2014-10-21 DIAGNOSIS — E099 Drug or chemical induced diabetes mellitus without complications: Secondary | ICD-10-CM | POA: Diagnosis not present

## 2014-10-21 DIAGNOSIS — Z940 Kidney transplant status: Secondary | ICD-10-CM | POA: Diagnosis not present

## 2014-10-22 DIAGNOSIS — Z940 Kidney transplant status: Secondary | ICD-10-CM | POA: Diagnosis not present

## 2014-10-22 DIAGNOSIS — L659 Nonscarring hair loss, unspecified: Secondary | ICD-10-CM | POA: Diagnosis not present

## 2014-10-22 DIAGNOSIS — Q613 Polycystic kidney, unspecified: Secondary | ICD-10-CM | POA: Diagnosis not present

## 2014-10-22 DIAGNOSIS — I4891 Unspecified atrial fibrillation: Secondary | ICD-10-CM | POA: Diagnosis not present

## 2014-10-23 DIAGNOSIS — E099 Drug or chemical induced diabetes mellitus without complications: Secondary | ICD-10-CM | POA: Diagnosis not present

## 2014-10-23 DIAGNOSIS — E559 Vitamin D deficiency, unspecified: Secondary | ICD-10-CM | POA: Diagnosis not present

## 2014-10-23 DIAGNOSIS — Z940 Kidney transplant status: Secondary | ICD-10-CM | POA: Diagnosis not present

## 2014-10-23 DIAGNOSIS — I10 Essential (primary) hypertension: Secondary | ICD-10-CM | POA: Diagnosis not present

## 2014-10-23 DIAGNOSIS — E780 Pure hypercholesterolemia: Secondary | ICD-10-CM | POA: Diagnosis not present

## 2014-10-27 DIAGNOSIS — S83242D Other tear of medial meniscus, current injury, left knee, subsequent encounter: Secondary | ICD-10-CM | POA: Diagnosis not present

## 2014-11-18 DIAGNOSIS — Z940 Kidney transplant status: Secondary | ICD-10-CM | POA: Diagnosis not present

## 2014-11-20 DIAGNOSIS — M94262 Chondromalacia, left knee: Secondary | ICD-10-CM | POA: Diagnosis not present

## 2014-11-20 DIAGNOSIS — M23222 Derangement of posterior horn of medial meniscus due to old tear or injury, left knee: Secondary | ICD-10-CM | POA: Diagnosis not present

## 2014-11-20 DIAGNOSIS — M23332 Other meniscus derangements, other medial meniscus, left knee: Secondary | ICD-10-CM | POA: Diagnosis not present

## 2014-12-03 DIAGNOSIS — S83242D Other tear of medial meniscus, current injury, left knee, subsequent encounter: Secondary | ICD-10-CM | POA: Diagnosis not present

## 2014-12-24 DIAGNOSIS — Z940 Kidney transplant status: Secondary | ICD-10-CM | POA: Diagnosis not present

## 2014-12-24 DIAGNOSIS — E1129 Type 2 diabetes mellitus with other diabetic kidney complication: Secondary | ICD-10-CM | POA: Diagnosis not present

## 2014-12-31 DIAGNOSIS — S83242D Other tear of medial meniscus, current injury, left knee, subsequent encounter: Secondary | ICD-10-CM | POA: Diagnosis not present

## 2015-01-20 DIAGNOSIS — Z940 Kidney transplant status: Secondary | ICD-10-CM | POA: Diagnosis not present

## 2015-02-06 DIAGNOSIS — Z940 Kidney transplant status: Secondary | ICD-10-CM | POA: Diagnosis not present

## 2015-02-06 DIAGNOSIS — L659 Nonscarring hair loss, unspecified: Secondary | ICD-10-CM | POA: Diagnosis not present

## 2015-02-06 DIAGNOSIS — Q613 Polycystic kidney, unspecified: Secondary | ICD-10-CM | POA: Diagnosis not present

## 2015-02-06 DIAGNOSIS — I4891 Unspecified atrial fibrillation: Secondary | ICD-10-CM | POA: Diagnosis not present

## 2015-02-25 DIAGNOSIS — Z940 Kidney transplant status: Secondary | ICD-10-CM | POA: Diagnosis not present

## 2015-03-10 DIAGNOSIS — E559 Vitamin D deficiency, unspecified: Secondary | ICD-10-CM | POA: Diagnosis not present

## 2015-03-10 DIAGNOSIS — E099 Drug or chemical induced diabetes mellitus without complications: Secondary | ICD-10-CM | POA: Diagnosis not present

## 2015-03-10 DIAGNOSIS — E780 Pure hypercholesterolemia: Secondary | ICD-10-CM | POA: Diagnosis not present

## 2015-03-11 DIAGNOSIS — S83242D Other tear of medial meniscus, current injury, left knee, subsequent encounter: Secondary | ICD-10-CM | POA: Diagnosis not present

## 2015-03-12 DIAGNOSIS — E780 Pure hypercholesterolemia: Secondary | ICD-10-CM | POA: Diagnosis not present

## 2015-03-12 DIAGNOSIS — E559 Vitamin D deficiency, unspecified: Secondary | ICD-10-CM | POA: Diagnosis not present

## 2015-03-12 DIAGNOSIS — Z940 Kidney transplant status: Secondary | ICD-10-CM | POA: Diagnosis not present

## 2015-03-12 DIAGNOSIS — E099 Drug or chemical induced diabetes mellitus without complications: Secondary | ICD-10-CM | POA: Diagnosis not present

## 2015-03-12 DIAGNOSIS — I10 Essential (primary) hypertension: Secondary | ICD-10-CM | POA: Diagnosis not present

## 2015-03-30 DIAGNOSIS — E1129 Type 2 diabetes mellitus with other diabetic kidney complication: Secondary | ICD-10-CM | POA: Diagnosis not present

## 2015-03-30 DIAGNOSIS — Z940 Kidney transplant status: Secondary | ICD-10-CM | POA: Diagnosis not present

## 2015-03-30 DIAGNOSIS — E663 Overweight: Secondary | ICD-10-CM | POA: Diagnosis not present

## 2015-03-30 DIAGNOSIS — E559 Vitamin D deficiency, unspecified: Secondary | ICD-10-CM | POA: Diagnosis not present

## 2015-03-30 DIAGNOSIS — E099 Drug or chemical induced diabetes mellitus without complications: Secondary | ICD-10-CM | POA: Diagnosis not present

## 2015-03-30 DIAGNOSIS — Z6835 Body mass index (BMI) 35.0-35.9, adult: Secondary | ICD-10-CM | POA: Diagnosis not present

## 2015-03-30 DIAGNOSIS — E785 Hyperlipidemia, unspecified: Secondary | ICD-10-CM | POA: Diagnosis not present

## 2015-03-30 DIAGNOSIS — Z0000 Encounter for general adult medical examination without abnormal findings: Secondary | ICD-10-CM | POA: Diagnosis not present

## 2015-03-30 DIAGNOSIS — I10 Essential (primary) hypertension: Secondary | ICD-10-CM | POA: Diagnosis not present

## 2015-03-30 DIAGNOSIS — Z23 Encounter for immunization: Secondary | ICD-10-CM | POA: Diagnosis not present

## 2015-03-30 DIAGNOSIS — Z1389 Encounter for screening for other disorder: Secondary | ICD-10-CM | POA: Diagnosis not present

## 2015-04-08 DIAGNOSIS — S83242D Other tear of medial meniscus, current injury, left knee, subsequent encounter: Secondary | ICD-10-CM | POA: Diagnosis not present

## 2015-04-30 DIAGNOSIS — Z940 Kidney transplant status: Secondary | ICD-10-CM | POA: Diagnosis not present

## 2015-05-20 DIAGNOSIS — Z940 Kidney transplant status: Secondary | ICD-10-CM | POA: Diagnosis not present

## 2015-06-10 DIAGNOSIS — E119 Type 2 diabetes mellitus without complications: Secondary | ICD-10-CM | POA: Diagnosis not present

## 2015-06-10 DIAGNOSIS — Z79899 Other long term (current) drug therapy: Secondary | ICD-10-CM | POA: Diagnosis not present

## 2015-06-10 DIAGNOSIS — I10 Essential (primary) hypertension: Secondary | ICD-10-CM | POA: Diagnosis not present

## 2015-06-10 DIAGNOSIS — Z940 Kidney transplant status: Secondary | ICD-10-CM | POA: Diagnosis not present

## 2015-06-10 DIAGNOSIS — Z23 Encounter for immunization: Secondary | ICD-10-CM | POA: Diagnosis not present

## 2015-06-10 DIAGNOSIS — E669 Obesity, unspecified: Secondary | ICD-10-CM | POA: Diagnosis not present

## 2015-07-01 DIAGNOSIS — E1129 Type 2 diabetes mellitus with other diabetic kidney complication: Secondary | ICD-10-CM | POA: Diagnosis not present

## 2015-07-01 DIAGNOSIS — Z940 Kidney transplant status: Secondary | ICD-10-CM | POA: Diagnosis not present

## 2015-07-24 DIAGNOSIS — E559 Vitamin D deficiency, unspecified: Secondary | ICD-10-CM | POA: Diagnosis not present

## 2015-07-24 DIAGNOSIS — E099 Drug or chemical induced diabetes mellitus without complications: Secondary | ICD-10-CM | POA: Diagnosis not present

## 2015-07-24 DIAGNOSIS — E7800 Pure hypercholesterolemia, unspecified: Secondary | ICD-10-CM | POA: Diagnosis not present

## 2015-07-30 DIAGNOSIS — Z940 Kidney transplant status: Secondary | ICD-10-CM | POA: Diagnosis not present

## 2015-07-31 DIAGNOSIS — E559 Vitamin D deficiency, unspecified: Secondary | ICD-10-CM | POA: Diagnosis not present

## 2015-07-31 DIAGNOSIS — E099 Drug or chemical induced diabetes mellitus without complications: Secondary | ICD-10-CM | POA: Diagnosis not present

## 2015-07-31 DIAGNOSIS — Z940 Kidney transplant status: Secondary | ICD-10-CM | POA: Diagnosis not present

## 2015-07-31 DIAGNOSIS — I10 Essential (primary) hypertension: Secondary | ICD-10-CM | POA: Diagnosis not present

## 2015-07-31 DIAGNOSIS — E7800 Pure hypercholesterolemia, unspecified: Secondary | ICD-10-CM | POA: Diagnosis not present

## 2015-08-04 DIAGNOSIS — Q613 Polycystic kidney, unspecified: Secondary | ICD-10-CM | POA: Diagnosis not present

## 2015-08-04 DIAGNOSIS — I4891 Unspecified atrial fibrillation: Secondary | ICD-10-CM | POA: Diagnosis not present

## 2015-08-04 DIAGNOSIS — L659 Nonscarring hair loss, unspecified: Secondary | ICD-10-CM | POA: Diagnosis not present

## 2015-08-04 DIAGNOSIS — Z940 Kidney transplant status: Secondary | ICD-10-CM | POA: Diagnosis not present

## 2015-08-04 DIAGNOSIS — I129 Hypertensive chronic kidney disease with stage 1 through stage 4 chronic kidney disease, or unspecified chronic kidney disease: Secondary | ICD-10-CM | POA: Diagnosis not present

## 2015-08-27 DIAGNOSIS — Z940 Kidney transplant status: Secondary | ICD-10-CM | POA: Diagnosis not present

## 2015-09-21 DIAGNOSIS — E1129 Type 2 diabetes mellitus with other diabetic kidney complication: Secondary | ICD-10-CM | POA: Diagnosis not present

## 2015-09-21 DIAGNOSIS — Z940 Kidney transplant status: Secondary | ICD-10-CM | POA: Diagnosis not present

## 2015-09-30 DIAGNOSIS — E785 Hyperlipidemia, unspecified: Secondary | ICD-10-CM | POA: Diagnosis not present

## 2015-09-30 DIAGNOSIS — Z940 Kidney transplant status: Secondary | ICD-10-CM | POA: Diagnosis not present

## 2015-09-30 DIAGNOSIS — Z7952 Long term (current) use of systemic steroids: Secondary | ICD-10-CM | POA: Diagnosis not present

## 2015-09-30 DIAGNOSIS — I10 Essential (primary) hypertension: Secondary | ICD-10-CM | POA: Diagnosis not present

## 2015-09-30 DIAGNOSIS — M25519 Pain in unspecified shoulder: Secondary | ICD-10-CM | POA: Diagnosis not present

## 2015-10-20 DIAGNOSIS — J069 Acute upper respiratory infection, unspecified: Secondary | ICD-10-CM | POA: Diagnosis not present

## 2015-11-02 DIAGNOSIS — E7800 Pure hypercholesterolemia, unspecified: Secondary | ICD-10-CM | POA: Diagnosis not present

## 2015-11-02 DIAGNOSIS — E099 Drug or chemical induced diabetes mellitus without complications: Secondary | ICD-10-CM | POA: Diagnosis not present

## 2015-11-02 DIAGNOSIS — Z940 Kidney transplant status: Secondary | ICD-10-CM | POA: Diagnosis not present

## 2015-11-02 DIAGNOSIS — E559 Vitamin D deficiency, unspecified: Secondary | ICD-10-CM | POA: Diagnosis not present

## 2015-11-04 DIAGNOSIS — I10 Essential (primary) hypertension: Secondary | ICD-10-CM | POA: Diagnosis not present

## 2015-11-04 DIAGNOSIS — Z940 Kidney transplant status: Secondary | ICD-10-CM | POA: Diagnosis not present

## 2015-11-04 DIAGNOSIS — E099 Drug or chemical induced diabetes mellitus without complications: Secondary | ICD-10-CM | POA: Diagnosis not present

## 2015-11-04 DIAGNOSIS — E559 Vitamin D deficiency, unspecified: Secondary | ICD-10-CM | POA: Diagnosis not present

## 2015-11-04 DIAGNOSIS — E7800 Pure hypercholesterolemia, unspecified: Secondary | ICD-10-CM | POA: Diagnosis not present

## 2015-11-05 DIAGNOSIS — Z7952 Long term (current) use of systemic steroids: Secondary | ICD-10-CM | POA: Diagnosis not present

## 2015-11-05 DIAGNOSIS — Z780 Asymptomatic menopausal state: Secondary | ICD-10-CM | POA: Diagnosis not present

## 2015-12-01 DIAGNOSIS — B259 Cytomegaloviral disease, unspecified: Secondary | ICD-10-CM | POA: Diagnosis not present

## 2015-12-01 DIAGNOSIS — Z940 Kidney transplant status: Secondary | ICD-10-CM | POA: Diagnosis not present

## 2015-12-30 DIAGNOSIS — E1129 Type 2 diabetes mellitus with other diabetic kidney complication: Secondary | ICD-10-CM | POA: Diagnosis not present

## 2015-12-30 DIAGNOSIS — E119 Type 2 diabetes mellitus without complications: Secondary | ICD-10-CM | POA: Diagnosis not present

## 2015-12-30 DIAGNOSIS — Z940 Kidney transplant status: Secondary | ICD-10-CM | POA: Diagnosis not present

## 2016-01-28 DIAGNOSIS — I129 Hypertensive chronic kidney disease with stage 1 through stage 4 chronic kidney disease, or unspecified chronic kidney disease: Secondary | ICD-10-CM | POA: Diagnosis not present

## 2016-01-28 DIAGNOSIS — Q613 Polycystic kidney, unspecified: Secondary | ICD-10-CM | POA: Diagnosis not present

## 2016-01-28 DIAGNOSIS — L659 Nonscarring hair loss, unspecified: Secondary | ICD-10-CM | POA: Diagnosis not present

## 2016-01-28 DIAGNOSIS — I4891 Unspecified atrial fibrillation: Secondary | ICD-10-CM | POA: Diagnosis not present

## 2016-01-28 DIAGNOSIS — Z940 Kidney transplant status: Secondary | ICD-10-CM | POA: Diagnosis not present

## 2016-02-02 DIAGNOSIS — E7800 Pure hypercholesterolemia, unspecified: Secondary | ICD-10-CM | POA: Diagnosis not present

## 2016-02-02 DIAGNOSIS — Z940 Kidney transplant status: Secondary | ICD-10-CM | POA: Diagnosis not present

## 2016-02-02 DIAGNOSIS — E099 Drug or chemical induced diabetes mellitus without complications: Secondary | ICD-10-CM | POA: Diagnosis not present

## 2016-02-04 DIAGNOSIS — I10 Essential (primary) hypertension: Secondary | ICD-10-CM | POA: Diagnosis not present

## 2016-02-04 DIAGNOSIS — E099 Drug or chemical induced diabetes mellitus without complications: Secondary | ICD-10-CM | POA: Diagnosis not present

## 2016-02-04 DIAGNOSIS — Z940 Kidney transplant status: Secondary | ICD-10-CM | POA: Diagnosis not present

## 2016-02-04 DIAGNOSIS — E7800 Pure hypercholesterolemia, unspecified: Secondary | ICD-10-CM | POA: Diagnosis not present

## 2016-02-04 DIAGNOSIS — E559 Vitamin D deficiency, unspecified: Secondary | ICD-10-CM | POA: Diagnosis not present

## 2016-03-03 DIAGNOSIS — Z940 Kidney transplant status: Secondary | ICD-10-CM | POA: Diagnosis not present

## 2016-04-06 DIAGNOSIS — Z940 Kidney transplant status: Secondary | ICD-10-CM | POA: Diagnosis not present

## 2016-04-06 DIAGNOSIS — E119 Type 2 diabetes mellitus without complications: Secondary | ICD-10-CM | POA: Diagnosis not present

## 2016-04-13 ENCOUNTER — Other Ambulatory Visit: Payer: Self-pay | Admitting: Internal Medicine

## 2016-04-13 DIAGNOSIS — E663 Overweight: Secondary | ICD-10-CM | POA: Diagnosis not present

## 2016-04-13 DIAGNOSIS — Z7952 Long term (current) use of systemic steroids: Secondary | ICD-10-CM | POA: Diagnosis not present

## 2016-04-13 DIAGNOSIS — Z6833 Body mass index (BMI) 33.0-33.9, adult: Secondary | ICD-10-CM | POA: Diagnosis not present

## 2016-04-13 DIAGNOSIS — E7800 Pure hypercholesterolemia, unspecified: Secondary | ICD-10-CM | POA: Diagnosis not present

## 2016-04-13 DIAGNOSIS — Z1231 Encounter for screening mammogram for malignant neoplasm of breast: Secondary | ICD-10-CM

## 2016-04-13 DIAGNOSIS — E099 Drug or chemical induced diabetes mellitus without complications: Secondary | ICD-10-CM | POA: Diagnosis not present

## 2016-04-13 DIAGNOSIS — Z1389 Encounter for screening for other disorder: Secondary | ICD-10-CM | POA: Diagnosis not present

## 2016-04-13 DIAGNOSIS — Z940 Kidney transplant status: Secondary | ICD-10-CM | POA: Diagnosis not present

## 2016-04-13 DIAGNOSIS — I10 Essential (primary) hypertension: Secondary | ICD-10-CM | POA: Diagnosis not present

## 2016-04-13 DIAGNOSIS — Z7984 Long term (current) use of oral hypoglycemic drugs: Secondary | ICD-10-CM | POA: Diagnosis not present

## 2016-04-13 DIAGNOSIS — B259 Cytomegaloviral disease, unspecified: Secondary | ICD-10-CM | POA: Diagnosis not present

## 2016-04-13 DIAGNOSIS — Z0000 Encounter for general adult medical examination without abnormal findings: Secondary | ICD-10-CM | POA: Diagnosis not present

## 2016-04-28 ENCOUNTER — Ambulatory Visit
Admission: RE | Admit: 2016-04-28 | Discharge: 2016-04-28 | Disposition: A | Discharge status: Home / Self Care | Payer: Medicare Other | Source: Ambulatory Visit | Attending: Internal Medicine | Admitting: Internal Medicine

## 2016-04-28 DIAGNOSIS — Z1231 Encounter for screening mammogram for malignant neoplasm of breast: Secondary | ICD-10-CM

## 2016-05-02 DIAGNOSIS — Z940 Kidney transplant status: Secondary | ICD-10-CM | POA: Diagnosis not present

## 2016-05-04 DIAGNOSIS — E099 Drug or chemical induced diabetes mellitus without complications: Secondary | ICD-10-CM | POA: Diagnosis not present

## 2016-05-04 DIAGNOSIS — E7800 Pure hypercholesterolemia, unspecified: Secondary | ICD-10-CM | POA: Diagnosis not present

## 2016-05-06 DIAGNOSIS — Z940 Kidney transplant status: Secondary | ICD-10-CM | POA: Diagnosis not present

## 2016-05-06 DIAGNOSIS — E7800 Pure hypercholesterolemia, unspecified: Secondary | ICD-10-CM | POA: Diagnosis not present

## 2016-05-06 DIAGNOSIS — E559 Vitamin D deficiency, unspecified: Secondary | ICD-10-CM | POA: Diagnosis not present

## 2016-05-06 DIAGNOSIS — I10 Essential (primary) hypertension: Secondary | ICD-10-CM | POA: Diagnosis not present

## 2016-05-06 DIAGNOSIS — E099 Drug or chemical induced diabetes mellitus without complications: Secondary | ICD-10-CM | POA: Diagnosis not present

## 2016-05-11 DIAGNOSIS — N898 Other specified noninflammatory disorders of vagina: Secondary | ICD-10-CM | POA: Diagnosis not present

## 2016-05-11 DIAGNOSIS — Z90710 Acquired absence of both cervix and uterus: Secondary | ICD-10-CM | POA: Diagnosis not present

## 2016-05-11 DIAGNOSIS — N952 Postmenopausal atrophic vaginitis: Secondary | ICD-10-CM | POA: Diagnosis not present

## 2016-05-11 DIAGNOSIS — N8110 Cystocele, unspecified: Secondary | ICD-10-CM | POA: Diagnosis not present

## 2016-06-08 DIAGNOSIS — I10 Essential (primary) hypertension: Secondary | ICD-10-CM | POA: Diagnosis not present

## 2016-06-08 DIAGNOSIS — E785 Hyperlipidemia, unspecified: Secondary | ICD-10-CM | POA: Diagnosis not present

## 2016-06-08 DIAGNOSIS — Z940 Kidney transplant status: Secondary | ICD-10-CM | POA: Diagnosis not present

## 2016-06-08 DIAGNOSIS — Z23 Encounter for immunization: Secondary | ICD-10-CM | POA: Diagnosis not present

## 2016-06-08 DIAGNOSIS — E119 Type 2 diabetes mellitus without complications: Secondary | ICD-10-CM | POA: Diagnosis not present

## 2016-06-08 DIAGNOSIS — Z79899 Other long term (current) drug therapy: Secondary | ICD-10-CM | POA: Diagnosis not present

## 2016-06-08 DIAGNOSIS — Q612 Polycystic kidney, adult type: Secondary | ICD-10-CM | POA: Diagnosis not present

## 2016-07-18 DIAGNOSIS — E7800 Pure hypercholesterolemia, unspecified: Secondary | ICD-10-CM | POA: Diagnosis not present

## 2016-07-18 DIAGNOSIS — E099 Drug or chemical induced diabetes mellitus without complications: Secondary | ICD-10-CM | POA: Diagnosis not present

## 2016-07-18 DIAGNOSIS — Z940 Kidney transplant status: Secondary | ICD-10-CM | POA: Diagnosis not present

## 2016-07-19 DIAGNOSIS — E559 Vitamin D deficiency, unspecified: Secondary | ICD-10-CM | POA: Diagnosis not present

## 2016-07-19 DIAGNOSIS — Z940 Kidney transplant status: Secondary | ICD-10-CM | POA: Diagnosis not present

## 2016-07-19 DIAGNOSIS — E7800 Pure hypercholesterolemia, unspecified: Secondary | ICD-10-CM | POA: Diagnosis not present

## 2016-07-19 DIAGNOSIS — E099 Drug or chemical induced diabetes mellitus without complications: Secondary | ICD-10-CM | POA: Diagnosis not present

## 2016-07-19 DIAGNOSIS — I10 Essential (primary) hypertension: Secondary | ICD-10-CM | POA: Diagnosis not present

## 2016-08-25 DIAGNOSIS — Z940 Kidney transplant status: Secondary | ICD-10-CM | POA: Diagnosis not present

## 2016-08-25 DIAGNOSIS — E1129 Type 2 diabetes mellitus with other diabetic kidney complication: Secondary | ICD-10-CM | POA: Diagnosis not present

## 2016-10-17 DIAGNOSIS — E099 Drug or chemical induced diabetes mellitus without complications: Secondary | ICD-10-CM | POA: Diagnosis not present

## 2016-10-17 DIAGNOSIS — T380X5A Adverse effect of glucocorticoids and synthetic analogues, initial encounter: Secondary | ICD-10-CM | POA: Diagnosis not present

## 2016-10-17 DIAGNOSIS — E7800 Pure hypercholesterolemia, unspecified: Secondary | ICD-10-CM | POA: Diagnosis not present

## 2016-10-21 DIAGNOSIS — E099 Drug or chemical induced diabetes mellitus without complications: Secondary | ICD-10-CM | POA: Diagnosis not present

## 2016-10-21 DIAGNOSIS — T380X5A Adverse effect of glucocorticoids and synthetic analogues, initial encounter: Secondary | ICD-10-CM | POA: Diagnosis not present

## 2016-10-21 DIAGNOSIS — E7800 Pure hypercholesterolemia, unspecified: Secondary | ICD-10-CM | POA: Diagnosis not present

## 2016-10-21 DIAGNOSIS — E559 Vitamin D deficiency, unspecified: Secondary | ICD-10-CM | POA: Diagnosis not present

## 2016-10-21 DIAGNOSIS — I10 Essential (primary) hypertension: Secondary | ICD-10-CM | POA: Diagnosis not present

## 2016-10-21 DIAGNOSIS — Z940 Kidney transplant status: Secondary | ICD-10-CM | POA: Diagnosis not present

## 2016-10-28 DIAGNOSIS — Z940 Kidney transplant status: Secondary | ICD-10-CM | POA: Diagnosis not present

## 2016-11-01 DIAGNOSIS — I4891 Unspecified atrial fibrillation: Secondary | ICD-10-CM | POA: Diagnosis not present

## 2016-11-01 DIAGNOSIS — L659 Nonscarring hair loss, unspecified: Secondary | ICD-10-CM | POA: Diagnosis not present

## 2016-11-01 DIAGNOSIS — Z6833 Body mass index (BMI) 33.0-33.9, adult: Secondary | ICD-10-CM | POA: Diagnosis not present

## 2016-11-01 DIAGNOSIS — Q613 Polycystic kidney, unspecified: Secondary | ICD-10-CM | POA: Diagnosis not present

## 2016-11-01 DIAGNOSIS — I129 Hypertensive chronic kidney disease with stage 1 through stage 4 chronic kidney disease, or unspecified chronic kidney disease: Secondary | ICD-10-CM | POA: Diagnosis not present

## 2016-11-01 DIAGNOSIS — Z940 Kidney transplant status: Secondary | ICD-10-CM | POA: Diagnosis not present

## 2016-12-08 DIAGNOSIS — Z940 Kidney transplant status: Secondary | ICD-10-CM | POA: Diagnosis not present

## 2016-12-31 ENCOUNTER — Emergency Department (HOSPITAL_BASED_OUTPATIENT_CLINIC_OR_DEPARTMENT_OTHER): Payer: Medicare Other

## 2016-12-31 ENCOUNTER — Encounter (HOSPITAL_BASED_OUTPATIENT_CLINIC_OR_DEPARTMENT_OTHER): Payer: Self-pay | Admitting: *Deleted

## 2016-12-31 ENCOUNTER — Emergency Department (HOSPITAL_BASED_OUTPATIENT_CLINIC_OR_DEPARTMENT_OTHER)
Admission: EM | Admit: 2016-12-31 | Discharge: 2016-12-31 | Disposition: A | Discharge status: Home / Self Care | Payer: Medicare Other | Attending: Emergency Medicine | Admitting: Emergency Medicine

## 2016-12-31 DIAGNOSIS — R05 Cough: Secondary | ICD-10-CM | POA: Insufficient documentation

## 2016-12-31 DIAGNOSIS — N189 Chronic kidney disease, unspecified: Secondary | ICD-10-CM | POA: Diagnosis not present

## 2016-12-31 DIAGNOSIS — Z7984 Long term (current) use of oral hypoglycemic drugs: Secondary | ICD-10-CM | POA: Insufficient documentation

## 2016-12-31 DIAGNOSIS — I1310 Hypertensive heart and chronic kidney disease without heart failure, with stage 1 through stage 4 chronic kidney disease, or unspecified chronic kidney disease: Secondary | ICD-10-CM | POA: Diagnosis not present

## 2016-12-31 DIAGNOSIS — J029 Acute pharyngitis, unspecified: Secondary | ICD-10-CM

## 2016-12-31 DIAGNOSIS — Z87891 Personal history of nicotine dependence: Secondary | ICD-10-CM | POA: Diagnosis not present

## 2016-12-31 DIAGNOSIS — E119 Type 2 diabetes mellitus without complications: Secondary | ICD-10-CM | POA: Insufficient documentation

## 2016-12-31 DIAGNOSIS — R059 Cough, unspecified: Secondary | ICD-10-CM

## 2016-12-31 LAB — BASIC METABOLIC PANEL
Anion gap: 8 (ref 5–15)
BUN: 7 mg/dL (ref 6–20)
CO2: 25 mmol/L (ref 22–32)
Calcium: 8.9 mg/dL (ref 8.9–10.3)
Chloride: 106 mmol/L (ref 101–111)
Creatinine, Ser: 1.03 mg/dL — ABNORMAL HIGH (ref 0.44–1.00)
GFR calc Af Amer: >60 mL/min (ref >60)
GFR calc non Af Amer: 54 mL/min — ABNORMAL LOW (ref >60)
GLUCOSE: 138 mg/dL — AB (ref 65–99)
POTASSIUM: 4.4 mmol/L (ref 3.5–5.1)
Sodium: 139 mmol/L (ref 135–145)

## 2016-12-31 LAB — CBC WITH DIFFERENTIAL/PLATELET
Basophils Absolute: 0 10*3/uL (ref 0.0–0.1)
Basophils Relative: 0 %
EOS PCT: 1 %
Eosinophils Absolute: 0 10*3/uL (ref 0.0–0.7)
HCT: 31.8 % — ABNORMAL LOW (ref 36.0–46.0)
Hemoglobin: 10.4 g/dL — ABNORMAL LOW (ref 12.0–15.0)
LYMPHS ABS: 0.9 10*3/uL (ref 0.7–4.0)
LYMPHS PCT: 18 %
MCH: 25.4 pg — AB (ref 26.0–34.0)
MCHC: 32.7 g/dL (ref 30.0–36.0)
MCV: 77.8 fL — AB (ref 78.0–100.0)
MONO ABS: 0.7 10*3/uL (ref 0.1–1.0)
MONOS PCT: 14 %
Neutro Abs: 3.6 10*3/uL (ref 1.7–7.7)
Neutrophils Relative %: 67 %
PLATELETS: 166 10*3/uL (ref 150–400)
RBC: 4.09 MIL/uL (ref 3.87–5.11)
RDW: 16.3 % — ABNORMAL HIGH (ref 11.5–15.5)
WBC: 5.3 10*3/uL (ref 4.0–10.5)

## 2016-12-31 MED ORDER — BENZONATATE 100 MG PO CAPS: 100.0000 mg | ORAL_CAPSULE | Freq: Three times a day (TID) | ORAL | 0 refills | Status: AC

## 2016-12-31 MED ORDER — ACETAMINOPHEN 500 MG PO TABS
1000.0000 mg | ORAL_TABLET | Freq: Once | ORAL | Status: AC
  Administered 2016-12-31: 1000 mg via ORAL
  Filled 2016-12-31: qty 2

## 2016-12-31 NOTE — ED Provider Notes (Signed)
[place] [facility] DEPT [facility] Provider Note   CSN: [id number] Arrival date & time: 12/31/16  0915     History   Chief Complaint Chief Complaint  Patient presents with  . URI    HPI Andrea Coleman is a 71 y.o. female with a history of  S/p tonsillectomy, polycystic kidney disease s/p renal transplant 2013, DM, HLD, HTN, and GERD who presents today with a cc of sore throat and temp of 101 at home. She is immunosuppressed with mycophenolic acid, prednisone, and prograft and takes Bactrim three times a week for UTI prevention.  Yesterday afternoon she began to feel her throat was "scrachy".  Her sore throat is associated with increasingly productive cough (green mucous), "sinus headache" and fevers.  She took tylenol yesterday which give her mild relief from her symptoms.  She has occasional episodes of shortness of breath that are only associated with coughing.  She denies n/v/d, no rashes, chills or hot flashes.  No changes in appetite, no known sick contacts.    HPI  Past Medical History:  Diagnosis Date  . Arthritis    hands  . Chronic kidney disease    polycystic  kidney disease  . 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 Diabetes mellitus without complication ([place])   . Elevated cholesterol   . GERD (gastroesophageal reflux disease)   . Hypertension     There are no active problems to display for this patient.   Past Surgical History:  Procedure Laterality Date  . ABDOMINAL HYSTERECTOMY     complete  . BREAST SURGERY     left-benign  . CHOLECYSTECTOMY    . COLONOSCOPY WITH PROPOFOL N/A 08/20/2013   Procedure: COLONOSCOPY WITH PROPOFOL;  Surgeon: Garlan Fair, MD;  Location: [facility];  Service: Endoscopy;  Laterality: N/A;  . DILATION AND CURETTAGE OF UTERUS    . 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 TONSILLECTOMY      OB History    No data available       Home Medications    Prior  to Admission medications   Medication Sig Start Date End Date Taking? Authorizing Provider  acidophilus (RISAQUAD) CAPS capsule Take 1 capsule by mouth daily.   Yes [provider]  atorvastatin (LIPITOR) 10 MG tablet Take 10 mg by mouth every evening.   Yes [provider]  famotidine (PEPCID) 20 MG tablet Take 20 mg by mouth 2 (two) times daily.   Yes [provider]  linagliptin (TRADJENTA) 5 MG TABS tablet Take 5 mg by mouth daily.   Yes [provider]  losartan (COZAAR) 50 MG tablet Take 50 mg by mouth every morning.   Yes [provider]  metFORMIN (GLUMETZA) 500 MG (MOD) 24 hr tablet Take 500 mg by mouth 2 (two) times daily with a meal.    Yes [provider]  metoprolol succinate (TOPROL-XL) 50 MG 24 hr tablet Take 50 mg by mouth 2 (two) times daily. Take with or immediately following a meal.   Yes [provider]  Mycophenolate Sodium (MYCOPHENOLIC ACID PO) Take by mouth.   Yes [provider]  predniSONE (DELTASONE) 5 MG tablet Take 10 mg by mouth daily with breakfast.   Yes [provider]  [place]   Yes [provider]  sulfamethoxazole-trimethoprim (BACTRIM DS) 800-160 MG per tablet Take 1 tablet by mouth. Barron Schmid, Wed, & fri   Yes [provider]  tacrolimus (PROGRAF) 1 MG capsule Take 5 mg by mouth 2 (two) times daily.   Yes [provider]  benzonatate (TESSALON) 100 MG capsule Take 1 capsule (100 mg total) by mouth every 8 (eight) hours. 12/31/16   Lorin Glass, PA-C  valGANciclovir (VALCYTE) 450 MG tablet Take 450 mg by mouth daily.    [provider]    Family History No family history on file.  Social History Social History  Substance Use Topics  . Smoking status: Former Smoker    Types: Cigarettes    Quit date: 04/04/1987  . Smokeless tobacco: Never Used  . Alcohol use No     Allergies   Patient has no known  allergies.   Review of Systems Review of Systems  Constitutional: Positive for fever. Negative for activity change, appetite change, chills and fatigue.  HENT: Positive for congestion, postnasal drip, rhinorrhea, sinus pain, sinus pressure and sore throat. Negative for ear pain, mouth sores, trouble swallowing and voice change.   Eyes: Negative for visual disturbance.  Respiratory: Positive for cough and shortness of breath (Only with coughing). Negative for chest tightness, wheezing and stridor.   Cardiovascular: Negative for chest pain and palpitations.  Gastrointestinal: Negative for abdominal pain, constipation, diarrhea, nausea and vomiting.  Genitourinary: Negative for dysuria, flank pain, frequency and urgency.  Musculoskeletal: Negative for arthralgias, myalgias, neck pain and neck stiffness.  Skin: Negative for color change, rash and wound.  Allergic/Immunologic: Positive for immunocompromised state.  Neurological: Positive for headaches. Negative for tremors, syncope, facial asymmetry, speech difficulty and light-headedness.     Physical Exam Updated Vital Signs BP (!) 153/83 (BP Location: Right Arm)   Pulse 66   Temp 99.2 F (37.3 C) (Oral)   Resp 18   Ht 5\' 3"  (1.6 m)   Wt 180 lb (81.6 kg)   SpO2 100%   BMI 31.89 kg/m   Physical Exam  Constitutional: She appears well-developed and well-nourished.  Non-toxic appearance.  HENT:  Head: Normocephalic and atraumatic.  Right Ear: Tympanic membrane, external ear and ear canal normal.  Left Ear: Tympanic membrane, external ear and ear canal normal.  Nose: Right sinus exhibits maxillary sinus tenderness. Right sinus exhibits no frontal sinus tenderness. Left sinus exhibits maxillary sinus tenderness. Left sinus exhibits no frontal sinus tenderness.  Mouth/Throat: Oropharynx is clear and moist. No oropharyngeal exudate.  Eyes: Conjunctivae are normal. Pupils are equal, round, and reactive to light.  Neck: Trachea normal and  full passive range of motion without pain. Neck supple. No neck rigidity. Normal range of motion present.  Cardiovascular: Normal rate, regular rhythm, normal heart sounds and intact distal pulses.   No murmur heard. Pulmonary/Chest: Effort normal and breath sounds normal. No accessory muscle usage. No respiratory distress. She has no decreased breath sounds. She has no wheezes. She has no rhonchi. She has no rales.  Abdominal: Soft. Normal appearance and bowel sounds are normal. She exhibits no distension. There is no tenderness. There is no rebound and no guarding.  Musculoskeletal: She exhibits no deformity.  Lymphadenopathy:    She has no cervical adenopathy.  Neurological: She is alert. She has normal strength. No sensory deficit. She exhibits normal muscle tone.  Skin: Skin is warm and dry. No rash noted. She is not diaphoretic.  Nursing note and vitals reviewed.    ED Treatments / Results  Labs (all labs ordered are listed, but only abnormal results are displayed) Labs Reviewed  BASIC METABOLIC PANEL - Abnormal; Notable  for the following:       Result Value   Glucose, Bld 138 (*)    Creatinine, Ser 1.03 (*)    GFR calc non Af Amer 54 (*)    All other components within normal limits  CBC WITH DIFFERENTIAL/PLATELET - Abnormal; Notable for the following:    Hemoglobin 10.4 (*)    HCT 31.8 (*)    MCV 77.8 (*)    MCH 25.4 (*)    RDW 16.3 (*)    All other components within normal limits    EKG  EKG Interpretation None       Radiology Dg Chest 2 View  Result Date: 12/31/2016 CLINICAL DATA:  Fever, cough, immunosuppressed EXAM: CHEST  2 VIEW COMPARISON:  None. FINDINGS: Heart is upper limits normal in size. No confluent opacities or effusions. No acute bony abnormality. IMPRESSION: No active cardiopulmonary disease. Electronically Signed   By: Rolm Baptise M.D.   On: 12/31/2016 10:32    Procedures Procedures (including critical care time)  Medications Ordered in  ED Medications  acetaminophen (TYLENOL) tablet 1,000 mg (1,000 mg Oral Given 12/31/16 1007)     Initial Impression / Assessment and Plan / ED Course  I have reviewed the triage vital signs and the nursing notes.  Pertinent labs & imaging results that were available during my care of the patient were reviewed by me and considered in my medical decision making (see chart for details).    Pt CXR negative for acute infiltrate. Labs appear patient baseline. Patients symptoms are consistent with URI, likely viral etiology. Discussed that antibiotics are not indicated for viral infections. Pt will be discharged with symptomatic treatment.  Verbalizes understanding and is agreeable with plan. Pt is hemodynamically stable & in NAD prior to dc.     At this time there does not appear to be any evidence of an acute emergency medical condition and the patient appears stable for discharge with appropriate outpatient follow up.Diagnosis was discussed with patient who verbalizes understanding and is agreeable to discharge. Pt was seen by Dr. Leonette Monarch  Who evaluated the patient and agrees with my plan.   Patient will be given Tessalon for her cough with instruction to follow up with her doctor for her slight elevation in her Cr.   Final Clinical Impressions(s) / ED Diagnoses   Final diagnoses:  Cough  Sore throat    New Prescriptions Discharge Medication List as of 12/31/2016 11:51 AM    START taking these medications   Details  benzonatate (TESSALON) 100 MG capsule Take 1 capsule (100 mg total) by mouth every 8 (eight) hours., Starting Sat 12/31/2016, Print         Lorin Glass, PA-C 12/31/16 1250    Fatima Blank, MD 12/31/16 2013

## 2016-12-31 NOTE — ED Notes (Signed)
Patient transported to X-ray 

## 2016-12-31 NOTE — ED Triage Notes (Signed)
Patient states she has a one day history of sinus congestion, productive cough with thick secretions, and sore throat.  States this morning she woke up with a fever of 101.0.

## 2016-12-31 NOTE — Discharge Instructions (Signed)
Please call your PCP on Monday for a follow up appointment.  Please return to the ED if your symptoms worsen.  You may consider gargling with warm salt water, using cough drops and other supportive measures for your symptoms today.   Today your creatine was 1.03 and your labs showed slight anemia.

## 2017-01-23 DIAGNOSIS — T380X5A Adverse effect of glucocorticoids and synthetic analogues, initial encounter: Secondary | ICD-10-CM | POA: Diagnosis not present

## 2017-01-23 DIAGNOSIS — E099 Drug or chemical induced diabetes mellitus without complications: Secondary | ICD-10-CM | POA: Diagnosis not present

## 2017-01-23 DIAGNOSIS — E7800 Pure hypercholesterolemia, unspecified: Secondary | ICD-10-CM | POA: Diagnosis not present

## 2017-01-27 DIAGNOSIS — Z833 Family history of diabetes mellitus: Secondary | ICD-10-CM | POA: Diagnosis not present

## 2017-01-27 DIAGNOSIS — E099 Drug or chemical induced diabetes mellitus without complications: Secondary | ICD-10-CM | POA: Diagnosis not present

## 2017-01-27 DIAGNOSIS — E7800 Pure hypercholesterolemia, unspecified: Secondary | ICD-10-CM | POA: Diagnosis not present

## 2017-01-27 DIAGNOSIS — T380X5A Adverse effect of glucocorticoids and synthetic analogues, initial encounter: Secondary | ICD-10-CM | POA: Diagnosis not present

## 2017-01-27 DIAGNOSIS — Z940 Kidney transplant status: Secondary | ICD-10-CM | POA: Diagnosis not present

## 2017-01-27 DIAGNOSIS — I10 Essential (primary) hypertension: Secondary | ICD-10-CM | POA: Diagnosis not present

## 2017-01-27 DIAGNOSIS — E559 Vitamin D deficiency, unspecified: Secondary | ICD-10-CM | POA: Diagnosis not present

## 2017-02-23 DIAGNOSIS — Z940 Kidney transplant status: Secondary | ICD-10-CM | POA: Diagnosis not present

## 2017-04-24 DIAGNOSIS — T380X5A Adverse effect of glucocorticoids and synthetic analogues, initial encounter: Secondary | ICD-10-CM | POA: Diagnosis not present

## 2017-04-24 DIAGNOSIS — E099 Drug or chemical induced diabetes mellitus without complications: Secondary | ICD-10-CM | POA: Diagnosis not present

## 2017-04-24 DIAGNOSIS — E7800 Pure hypercholesterolemia, unspecified: Secondary | ICD-10-CM | POA: Diagnosis not present

## 2017-04-24 DIAGNOSIS — E559 Vitamin D deficiency, unspecified: Secondary | ICD-10-CM | POA: Diagnosis not present

## 2017-04-26 ENCOUNTER — Other Ambulatory Visit: Payer: Self-pay | Admitting: Internal Medicine

## 2017-04-26 DIAGNOSIS — Z1231 Encounter for screening mammogram for malignant neoplasm of breast: Secondary | ICD-10-CM

## 2017-05-01 DIAGNOSIS — E559 Vitamin D deficiency, unspecified: Secondary | ICD-10-CM | POA: Diagnosis not present

## 2017-05-01 DIAGNOSIS — I10 Essential (primary) hypertension: Secondary | ICD-10-CM | POA: Diagnosis not present

## 2017-05-01 DIAGNOSIS — T380X5A Adverse effect of glucocorticoids and synthetic analogues, initial encounter: Secondary | ICD-10-CM | POA: Diagnosis not present

## 2017-05-01 DIAGNOSIS — E099 Drug or chemical induced diabetes mellitus without complications: Secondary | ICD-10-CM | POA: Diagnosis not present

## 2017-05-01 DIAGNOSIS — Z940 Kidney transplant status: Secondary | ICD-10-CM | POA: Diagnosis not present

## 2017-05-01 DIAGNOSIS — E7800 Pure hypercholesterolemia, unspecified: Secondary | ICD-10-CM | POA: Diagnosis not present

## 2017-05-09 DIAGNOSIS — I10 Essential (primary) hypertension: Secondary | ICD-10-CM | POA: Diagnosis not present

## 2017-05-09 DIAGNOSIS — E785 Hyperlipidemia, unspecified: Secondary | ICD-10-CM | POA: Diagnosis not present

## 2017-05-09 DIAGNOSIS — N183 Chronic kidney disease, stage 3 (moderate): Secondary | ICD-10-CM | POA: Diagnosis not present

## 2017-05-09 DIAGNOSIS — E099 Drug or chemical induced diabetes mellitus without complications: Secondary | ICD-10-CM | POA: Diagnosis not present

## 2017-05-12 ENCOUNTER — Ambulatory Visit
Admission: RE | Admit: 2017-05-12 | Discharge: 2017-05-12 | Disposition: A | Discharge status: Home / Self Care | Payer: Medicare Other | Source: Ambulatory Visit | Attending: Internal Medicine | Admitting: Internal Medicine

## 2017-05-12 DIAGNOSIS — Z1231 Encounter for screening mammogram for malignant neoplasm of breast: Secondary | ICD-10-CM | POA: Diagnosis not present

## 2017-05-12 DIAGNOSIS — Z23 Encounter for immunization: Secondary | ICD-10-CM | POA: Diagnosis not present

## 2017-05-30 DIAGNOSIS — Z940 Kidney transplant status: Secondary | ICD-10-CM | POA: Diagnosis not present

## 2017-06-26 DIAGNOSIS — Z79899 Other long term (current) drug therapy: Secondary | ICD-10-CM | POA: Diagnosis not present

## 2017-06-26 DIAGNOSIS — Q612 Polycystic kidney, adult type: Secondary | ICD-10-CM | POA: Diagnosis not present

## 2017-07-13 DIAGNOSIS — I4891 Unspecified atrial fibrillation: Secondary | ICD-10-CM | POA: Diagnosis not present

## 2017-07-13 DIAGNOSIS — Q613 Polycystic kidney, unspecified: Secondary | ICD-10-CM | POA: Diagnosis not present

## 2017-07-13 DIAGNOSIS — Z6833 Body mass index (BMI) 33.0-33.9, adult: Secondary | ICD-10-CM | POA: Diagnosis not present

## 2017-07-13 DIAGNOSIS — I129 Hypertensive chronic kidney disease with stage 1 through stage 4 chronic kidney disease, or unspecified chronic kidney disease: Secondary | ICD-10-CM | POA: Diagnosis not present

## 2017-07-13 DIAGNOSIS — Z940 Kidney transplant status: Secondary | ICD-10-CM | POA: Diagnosis not present

## 2017-08-03 ENCOUNTER — Other Ambulatory Visit: Payer: Self-pay

## 2017-08-03 ENCOUNTER — Encounter (HOSPITAL_BASED_OUTPATIENT_CLINIC_OR_DEPARTMENT_OTHER): Payer: Self-pay

## 2017-08-03 ENCOUNTER — Emergency Department (HOSPITAL_BASED_OUTPATIENT_CLINIC_OR_DEPARTMENT_OTHER)
Admission: EM | Admit: 2017-08-03 | Discharge: 2017-08-03 | Disposition: A | Discharge status: Home / Self Care | Payer: Medicare Other | Attending: Emergency Medicine | Admitting: Emergency Medicine

## 2017-08-03 DIAGNOSIS — Z9049 Acquired absence of other specified parts of digestive tract: Secondary | ICD-10-CM | POA: Insufficient documentation

## 2017-08-03 DIAGNOSIS — Z87891 Personal history of nicotine dependence: Secondary | ICD-10-CM | POA: Diagnosis not present

## 2017-08-03 DIAGNOSIS — Z940 Kidney transplant status: Secondary | ICD-10-CM | POA: Insufficient documentation

## 2017-08-03 DIAGNOSIS — Z79899 Other long term (current) drug therapy: Secondary | ICD-10-CM | POA: Diagnosis not present

## 2017-08-03 DIAGNOSIS — I129 Hypertensive chronic kidney disease with stage 1 through stage 4 chronic kidney disease, or unspecified chronic kidney disease: Secondary | ICD-10-CM | POA: Insufficient documentation

## 2017-08-03 DIAGNOSIS — Z7984 Long term (current) use of oral hypoglycemic drugs: Secondary | ICD-10-CM | POA: Insufficient documentation

## 2017-08-03 DIAGNOSIS — K0889 Other specified disorders of teeth and supporting structures: Secondary | ICD-10-CM | POA: Diagnosis not present

## 2017-08-03 DIAGNOSIS — E1122 Type 2 diabetes mellitus with diabetic chronic kidney disease: Secondary | ICD-10-CM | POA: Insufficient documentation

## 2017-08-03 DIAGNOSIS — N189 Chronic kidney disease, unspecified: Secondary | ICD-10-CM | POA: Diagnosis not present

## 2017-08-03 MED ORDER — PENICILLIN V POTASSIUM 500 MG PO TABS: 500.0000 mg | ORAL_TABLET | Freq: Four times a day (QID) | ORAL | 0 refills | Status: AC

## 2017-08-03 MED ORDER — OXYCODONE-ACETAMINOPHEN 5-325 MG PO TABS
1.0000 | ORAL_TABLET | Freq: Once | ORAL | Status: AC
  Administered 2017-08-03: 1 via ORAL
  Filled 2017-08-03: qty 1

## 2017-08-03 MED ORDER — PENICILLIN V POTASSIUM 500 MG PO TABS: 500.0000 mg | ORAL_TABLET | Freq: Four times a day (QID) | ORAL | 0 refills | Status: DC

## 2017-08-03 MED FILL — PENICILLIN VK 500 MG TABLET: 500 | 7 days supply | Qty: 28 | Fill #0

## 2017-08-03 NOTE — ED Triage Notes (Signed)
Pt c/o pain to right upper tooth x 1 week-also now having HA-states she attempted to see dentis but no appt-NAD-steady gait

## 2017-08-03 NOTE — ED Provider Notes (Signed)
[facility] EMERGENCY DEPARTMENT Provider Note   CSN: [id number] Arrival date & time: 08/03/17  1554     History   Chief Complaint Chief Complaint  Patient presents with  . Dental Pain   HPI Andrea Coleman is a 71 y.o. female.  Patient is a 71 year old female presenting with 24 hours of dental and facial pain in the right side.  Patient wears a retainer and notes that one week ago she put in the retainer and "poked" her gum.  She had no irritation or pain up until last night when she noticed new onset pain in her gum, right-sided facial swelling and pain.  She has had no fevers or chills, diarrhea, or vomiting.  She has no pain with extraocular movements, no changes in vision or headaches.      Past Medical History:  Diagnosis Date  . Arthritis    hands  . Chronic kidney disease    polycystic  kidney disease  . 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 Diabetes mellitus without complication ([place])   . Elevated cholesterol   . GERD (gastroesophageal reflux disease)   . Hypertension     There are no active problems to display for this patient.   Past Surgical History:  Procedure Laterality Date  . ABDOMINAL HYSTERECTOMY     complete  . BREAST CYST ASPIRATION Left    No Scar's seen   . BREAST SURGERY     left-benign  . CHOLECYSTECTOMY    . COLONOSCOPY WITH PROPOFOL N/A 08/20/2013   Procedure: COLONOSCOPY WITH PROPOFOL;  Surgeon: Garlan Fair, MD;  Location: [facility];  Service: Endoscopy;  Laterality: N/A;  . DILATION AND CURETTAGE OF UTERUS    . 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 TONSILLECTOMY      OB History    No data available       Home Medications    Prior to Admission medications   Medication Sig Start Date End Date Taking? Authorizing Provider  acidophilus (RISAQUAD) CAPS capsule Take 1 capsule by mouth daily.    [provider]  atorvastatin  (LIPITOR) 10 MG tablet Take 10 mg by mouth every evening.    [provider]  benzonatate (TESSALON) 100 MG capsule Take 1 capsule (100 mg total) by mouth every 8 (eight) hours. 12/31/16   Lorin Glass, PA-C  famotidine (PEPCID) 20 MG tablet Take 20 mg by mouth 2 (two) times daily.    [provider]  linagliptin (TRADJENTA) 5 MG TABS tablet Take 5 mg by mouth daily.    [provider]  losartan (COZAAR) 50 MG tablet Take 50 mg by mouth every morning.    [provider]  metFORMIN (GLUMETZA) 500 MG (MOD) 24 hr tablet Take 500 mg by mouth 2 (two) times daily with a meal.     [provider]  metoprolol succinate (TOPROL-XL) 50 MG 24 hr tablet Take 50 mg by mouth 2 (two) times daily. Take with or immediately following a meal.    [provider]  Mycophenolate Sodium (MYCOPHENOLIC ACID PO) Take by mouth.    [provider]  penicillin v potassium (VEETID) 500 MG tablet Take 1 tablet (500 mg total) by mouth 4 (four) times daily for 7 days. 08/03/17 08/10/17  Eloise Levels, MD  predniSONE (DELTASONE) 5 MG tablet Take 10 mg by mouth daily with breakfast.    [provider]  PRESCRIPTION MEDICATION Microfinolic    [provider]  sulfamethoxazole-trimethoprim (BACTRIM DS) 800-160 MG per tablet Take 1 tablet by mouth. Barron Schmid, Wed, & fri    [provider]  tacrolimus (PROGRAF) 1 MG capsule Take 5 mg by mouth 2 (two) times daily.    [provider]  valGANciclovir (VALCYTE) 450 MG tablet Take 450 mg by mouth daily.    [provider]    Family History Family History  Problem Relation Age of Onset  . Breast cancer Neg Hx     Social History Social History   Tobacco Use  . Smoking status: Former Smoker    Types: Cigarettes    Last attempt to quit: 04/04/1987    Years since quitting: 30.3  . Smokeless tobacco: Never Used  Substance Use Topics  . Alcohol use: No  . Drug use: No       Allergies   Patient has no known allergies.   Review of Systems Review of Systems  Constitutional: Negative.   HENT: Positive for dental problem and facial swelling.   Eyes: Negative.   Respiratory: Negative.   Cardiovascular: Negative.      Physical Exam Updated Vital Signs BP (!) 200/82 (BP Location: Left Arm)   Pulse 69   Temp 99.9 F (37.7 C) (Oral)   Resp 18   Ht 5\' 4"  (1.626 m)   Wt 88 kg (194 lb)   SpO2 100%   BMI 33.30 kg/m   Physical Exam  Constitutional: She is oriented to person, place, and time. She appears well-developed and well-nourished. No distress.  HENT:  Head: Normocephalic and atraumatic.  Mouth/Throat: Oropharyngeal exudate present.  Right-sided facial swelling with some mild erythema and tenderness to palpation throughout.  No tenderness to palpation on superior or inferior rim of the orbits.  Poor dentition with minuscule abscess on the right upper gum with minimal surrounding erythema and no fluctuance.   Eyes: Conjunctivae and EOM are normal. Pupils are equal, round, and reactive to light.  Neck: Normal range of motion. Neck supple.  Cardiovascular: Normal rate and regular rhythm. Exam reveals no gallop and no friction rub.  No murmur heard. Pulmonary/Chest: Effort normal and breath sounds normal. No respiratory distress. She has no wheezes.  Abdominal: Soft. She exhibits no distension. There is no tenderness.  Musculoskeletal: Normal range of motion. She exhibits no edema, tenderness or deformity.  Lymphadenopathy:    She has no cervical adenopathy.  Neurological: She is alert and oriented to person, place, and time.  Skin: Skin is warm and dry. Capillary refill takes less than 2 seconds. No rash noted. She is not diaphoretic.  Psychiatric: She has a normal mood and affect.   ED Treatments / Results  Labs (all labs ordered are listed, but only abnormal results are displayed) Labs Reviewed - No data to display  EKG  EKG  Interpretation None       Radiology No results found.  Procedures Procedures (including critical care time)  Medications Ordered in ED Medications  oxyCODONE-acetaminophen (PERCOCET/ROXICET) 5-325 MG per tablet 1 tablet (not administered)     Initial Impression / Assessment and Plan / ED Course  I have reviewed the triage vital signs and the nursing notes.  Pertinent labs & imaging results that were available during my care of the patient were reviewed by me and considered in my medical decision making (see chart for details).    Andrea Coleman is a 71 year old female with 24 hours of right facial pain  with tenderness to palpation throughout the right cheek excluding the orbital rims.  No neurological deficits and no pain with extraocular movements.  No obvious overlying cellulitis.  Minuscule abscess on the right upper gum with no apparent fluctuance.  Will treat with penicillin VK and recommended the patient avoid wearing her retainer until symptoms improve.  Discussed return precautions including throat closure, difficulty swallowing, pain with extraocular movements, worsening pain on the cheek and development of redness she is to come back to the emergency department.  Otherwise she can follow-up with her primary doctor as needed.   Final Clinical Impressions(s) / ED Diagnoses   Final diagnoses:  Pain, dental    ED Discharge Orders        Ordered    penicillin v potassium (VEETID) 500 MG tablet  4 times daily,   Status:  Discontinued     08/03/17 1644    penicillin v potassium (VEETID) 500 MG tablet  4 times daily     08/03/17 1645       Eloise Levels, MD 08/03/17 1651    Tanna Furry, MD 08/03/17 [phone]

## 2017-08-03 NOTE — Discharge Instructions (Signed)
Andrea Coleman, you were seen today for dental pain and you likely have an infection.  I gave you some pain medication and also prescribed an antibiotic.  If you have worsening pain, notice difficulty swallowing, throat closure or changes in vision or pain in your eyes you need to come back to the emergency department.  Otherwise you can follow-up with your primary doctor as needed.

## 2017-08-21 DIAGNOSIS — Z7984 Long term (current) use of oral hypoglycemic drugs: Secondary | ICD-10-CM | POA: Diagnosis not present

## 2017-08-21 DIAGNOSIS — E099 Drug or chemical induced diabetes mellitus without complications: Secondary | ICD-10-CM | POA: Diagnosis not present

## 2017-08-21 DIAGNOSIS — N183 Chronic kidney disease, stage 3 (moderate): Secondary | ICD-10-CM | POA: Diagnosis not present

## 2017-08-21 DIAGNOSIS — E785 Hyperlipidemia, unspecified: Secondary | ICD-10-CM | POA: Diagnosis not present

## 2017-08-21 DIAGNOSIS — I10 Essential (primary) hypertension: Secondary | ICD-10-CM | POA: Diagnosis not present

## 2017-08-29 DIAGNOSIS — E099 Drug or chemical induced diabetes mellitus without complications: Secondary | ICD-10-CM | POA: Diagnosis not present

## 2017-08-29 DIAGNOSIS — E7800 Pure hypercholesterolemia, unspecified: Secondary | ICD-10-CM | POA: Diagnosis not present

## 2017-08-29 DIAGNOSIS — T380X5A Adverse effect of glucocorticoids and synthetic analogues, initial encounter: Secondary | ICD-10-CM | POA: Diagnosis not present

## 2017-09-01 DIAGNOSIS — I10 Essential (primary) hypertension: Secondary | ICD-10-CM | POA: Diagnosis not present

## 2017-09-01 DIAGNOSIS — Z79899 Other long term (current) drug therapy: Secondary | ICD-10-CM | POA: Diagnosis not present

## 2017-09-01 DIAGNOSIS — T380X5D Adverse effect of glucocorticoids and synthetic analogues, subsequent encounter: Secondary | ICD-10-CM | POA: Diagnosis not present

## 2017-09-01 DIAGNOSIS — Z7984 Long term (current) use of oral hypoglycemic drugs: Secondary | ICD-10-CM | POA: Diagnosis not present

## 2017-09-01 DIAGNOSIS — E099 Drug or chemical induced diabetes mellitus without complications: Secondary | ICD-10-CM | POA: Diagnosis not present

## 2017-09-01 DIAGNOSIS — E7800 Pure hypercholesterolemia, unspecified: Secondary | ICD-10-CM | POA: Diagnosis not present

## 2017-09-01 DIAGNOSIS — E559 Vitamin D deficiency, unspecified: Secondary | ICD-10-CM | POA: Diagnosis not present

## 2017-09-01 DIAGNOSIS — Z940 Kidney transplant status: Secondary | ICD-10-CM | POA: Diagnosis not present

## 2017-09-22 DIAGNOSIS — Z940 Kidney transplant status: Secondary | ICD-10-CM | POA: Diagnosis not present

## 2017-10-05 DIAGNOSIS — B259 Cytomegaloviral disease, unspecified: Secondary | ICD-10-CM | POA: Diagnosis not present

## 2017-10-05 DIAGNOSIS — Z1389 Encounter for screening for other disorder: Secondary | ICD-10-CM | POA: Diagnosis not present

## 2017-10-05 DIAGNOSIS — N183 Chronic kidney disease, stage 3 (moderate): Secondary | ICD-10-CM | POA: Diagnosis not present

## 2017-10-05 DIAGNOSIS — E1122 Type 2 diabetes mellitus with diabetic chronic kidney disease: Secondary | ICD-10-CM | POA: Diagnosis not present

## 2017-10-05 DIAGNOSIS — Z0000 Encounter for general adult medical examination without abnormal findings: Secondary | ICD-10-CM | POA: Diagnosis not present

## 2017-10-05 DIAGNOSIS — I10 Essential (primary) hypertension: Secondary | ICD-10-CM | POA: Diagnosis not present

## 2017-10-05 DIAGNOSIS — Z940 Kidney transplant status: Secondary | ICD-10-CM | POA: Diagnosis not present

## 2017-10-05 DIAGNOSIS — E7800 Pure hypercholesterolemia, unspecified: Secondary | ICD-10-CM | POA: Diagnosis not present

## 2017-10-05 DIAGNOSIS — F4321 Adjustment disorder with depressed mood: Secondary | ICD-10-CM | POA: Diagnosis not present

## 2017-10-05 DIAGNOSIS — N819 Female genital prolapse, unspecified: Secondary | ICD-10-CM | POA: Diagnosis not present

## 2017-10-11 DIAGNOSIS — E785 Hyperlipidemia, unspecified: Secondary | ICD-10-CM | POA: Diagnosis not present

## 2017-10-11 DIAGNOSIS — F4321 Adjustment disorder with depressed mood: Secondary | ICD-10-CM | POA: Diagnosis not present

## 2017-10-11 DIAGNOSIS — N183 Chronic kidney disease, stage 3 (moderate): Secondary | ICD-10-CM | POA: Diagnosis not present

## 2017-10-11 DIAGNOSIS — I10 Essential (primary) hypertension: Secondary | ICD-10-CM | POA: Diagnosis not present

## 2017-10-11 DIAGNOSIS — Z7984 Long term (current) use of oral hypoglycemic drugs: Secondary | ICD-10-CM | POA: Diagnosis not present

## 2017-10-11 DIAGNOSIS — E1122 Type 2 diabetes mellitus with diabetic chronic kidney disease: Secondary | ICD-10-CM | POA: Diagnosis not present

## 2017-10-11 DIAGNOSIS — E099 Drug or chemical induced diabetes mellitus without complications: Secondary | ICD-10-CM | POA: Diagnosis not present

## 2017-10-20 ENCOUNTER — Other Ambulatory Visit: Payer: Self-pay | Admitting: Cardiology

## 2017-10-20 ENCOUNTER — Ambulatory Visit
Admission: RE | Admit: 2017-10-20 | Discharge: 2017-10-20 | Disposition: A | Discharge status: Home / Self Care | Payer: Medicare Other | Source: Ambulatory Visit | Attending: Cardiology | Admitting: Cardiology

## 2017-10-20 DIAGNOSIS — R0600 Dyspnea, unspecified: Secondary | ICD-10-CM | POA: Diagnosis not present

## 2017-10-20 DIAGNOSIS — R0602 Shortness of breath: Secondary | ICD-10-CM | POA: Diagnosis not present

## 2017-10-20 DIAGNOSIS — I480 Paroxysmal atrial fibrillation: Secondary | ICD-10-CM | POA: Diagnosis not present

## 2017-10-23 DIAGNOSIS — E785 Hyperlipidemia, unspecified: Secondary | ICD-10-CM | POA: Diagnosis not present

## 2017-10-23 DIAGNOSIS — R0600 Dyspnea, unspecified: Secondary | ICD-10-CM | POA: Diagnosis not present

## 2017-10-23 DIAGNOSIS — R0602 Shortness of breath: Secondary | ICD-10-CM | POA: Diagnosis not present

## 2017-10-23 DIAGNOSIS — E1122 Type 2 diabetes mellitus with diabetic chronic kidney disease: Secondary | ICD-10-CM | POA: Diagnosis not present

## 2017-10-23 DIAGNOSIS — E669 Obesity, unspecified: Secondary | ICD-10-CM | POA: Diagnosis not present

## 2017-10-23 DIAGNOSIS — R0609 Other forms of dyspnea: Secondary | ICD-10-CM | POA: Diagnosis not present

## 2017-10-23 DIAGNOSIS — I480 Paroxysmal atrial fibrillation: Secondary | ICD-10-CM | POA: Diagnosis not present

## 2017-10-23 DIAGNOSIS — Z940 Kidney transplant status: Secondary | ICD-10-CM | POA: Diagnosis not present

## 2017-10-23 DIAGNOSIS — I119 Hypertensive heart disease without heart failure: Secondary | ICD-10-CM | POA: Diagnosis not present

## 2017-10-23 DIAGNOSIS — E668 Other obesity: Secondary | ICD-10-CM | POA: Diagnosis not present

## 2017-10-25 ENCOUNTER — Other Ambulatory Visit: Payer: Self-pay | Admitting: Obstetrics and Gynecology

## 2017-10-25 DIAGNOSIS — N761 Subacute and chronic vaginitis: Secondary | ICD-10-CM | POA: Diagnosis not present

## 2017-10-25 DIAGNOSIS — N8110 Cystocele, unspecified: Secondary | ICD-10-CM | POA: Diagnosis not present

## 2017-10-25 DIAGNOSIS — N952 Postmenopausal atrophic vaginitis: Secondary | ICD-10-CM | POA: Diagnosis not present

## 2017-10-26 DIAGNOSIS — R0609 Other forms of dyspnea: Secondary | ICD-10-CM | POA: Diagnosis not present

## 2017-10-26 DIAGNOSIS — R0600 Dyspnea, unspecified: Secondary | ICD-10-CM | POA: Diagnosis not present

## 2017-10-26 DIAGNOSIS — I480 Paroxysmal atrial fibrillation: Secondary | ICD-10-CM | POA: Diagnosis not present

## 2017-10-26 DIAGNOSIS — R0602 Shortness of breath: Secondary | ICD-10-CM | POA: Diagnosis not present

## 2017-10-26 DIAGNOSIS — E1122 Type 2 diabetes mellitus with diabetic chronic kidney disease: Secondary | ICD-10-CM | POA: Diagnosis not present

## 2017-10-26 DIAGNOSIS — E669 Obesity, unspecified: Secondary | ICD-10-CM | POA: Diagnosis not present

## 2017-10-26 DIAGNOSIS — I119 Hypertensive heart disease without heart failure: Secondary | ICD-10-CM | POA: Diagnosis not present

## 2017-10-26 DIAGNOSIS — E668 Other obesity: Secondary | ICD-10-CM | POA: Diagnosis not present

## 2017-10-26 DIAGNOSIS — Z940 Kidney transplant status: Secondary | ICD-10-CM | POA: Diagnosis not present

## 2017-10-26 DIAGNOSIS — E785 Hyperlipidemia, unspecified: Secondary | ICD-10-CM | POA: Diagnosis not present

## 2017-11-08 DIAGNOSIS — Z7984 Long term (current) use of oral hypoglycemic drugs: Secondary | ICD-10-CM | POA: Diagnosis not present

## 2017-11-08 DIAGNOSIS — N183 Chronic kidney disease, stage 3 (moderate): Secondary | ICD-10-CM | POA: Diagnosis not present

## 2017-11-08 DIAGNOSIS — I10 Essential (primary) hypertension: Secondary | ICD-10-CM | POA: Diagnosis not present

## 2017-11-08 DIAGNOSIS — E1122 Type 2 diabetes mellitus with diabetic chronic kidney disease: Secondary | ICD-10-CM | POA: Diagnosis not present

## 2017-11-08 DIAGNOSIS — E785 Hyperlipidemia, unspecified: Secondary | ICD-10-CM | POA: Diagnosis not present

## 2017-11-08 DIAGNOSIS — F4321 Adjustment disorder with depressed mood: Secondary | ICD-10-CM | POA: Diagnosis not present

## 2017-11-08 DIAGNOSIS — E099 Drug or chemical induced diabetes mellitus without complications: Secondary | ICD-10-CM | POA: Diagnosis not present

## 2017-11-20 DIAGNOSIS — R0602 Shortness of breath: Secondary | ICD-10-CM | POA: Diagnosis not present

## 2017-11-20 DIAGNOSIS — E669 Obesity, unspecified: Secondary | ICD-10-CM | POA: Diagnosis not present

## 2017-11-20 DIAGNOSIS — I480 Paroxysmal atrial fibrillation: Secondary | ICD-10-CM | POA: Diagnosis not present

## 2017-11-20 DIAGNOSIS — Z940 Kidney transplant status: Secondary | ICD-10-CM | POA: Diagnosis not present

## 2017-11-20 DIAGNOSIS — R0600 Dyspnea, unspecified: Secondary | ICD-10-CM | POA: Diagnosis not present

## 2017-11-20 DIAGNOSIS — E785 Hyperlipidemia, unspecified: Secondary | ICD-10-CM | POA: Diagnosis not present

## 2017-11-20 DIAGNOSIS — I119 Hypertensive heart disease without heart failure: Secondary | ICD-10-CM | POA: Diagnosis not present

## 2017-11-20 DIAGNOSIS — R0609 Other forms of dyspnea: Secondary | ICD-10-CM | POA: Diagnosis not present

## 2017-11-20 DIAGNOSIS — E668 Other obesity: Secondary | ICD-10-CM | POA: Diagnosis not present

## 2017-11-20 DIAGNOSIS — E1122 Type 2 diabetes mellitus with diabetic chronic kidney disease: Secondary | ICD-10-CM | POA: Diagnosis not present

## 2017-11-27 DIAGNOSIS — E7800 Pure hypercholesterolemia, unspecified: Secondary | ICD-10-CM | POA: Diagnosis not present

## 2017-11-27 DIAGNOSIS — T380X5A Adverse effect of glucocorticoids and synthetic analogues, initial encounter: Secondary | ICD-10-CM | POA: Diagnosis not present

## 2017-11-27 DIAGNOSIS — E099 Drug or chemical induced diabetes mellitus without complications: Secondary | ICD-10-CM | POA: Diagnosis not present

## 2017-12-07 DIAGNOSIS — F4321 Adjustment disorder with depressed mood: Secondary | ICD-10-CM | POA: Diagnosis not present

## 2017-12-07 DIAGNOSIS — E099 Drug or chemical induced diabetes mellitus without complications: Secondary | ICD-10-CM | POA: Diagnosis not present

## 2017-12-07 DIAGNOSIS — N183 Chronic kidney disease, stage 3 (moderate): Secondary | ICD-10-CM | POA: Diagnosis not present

## 2017-12-07 DIAGNOSIS — E785 Hyperlipidemia, unspecified: Secondary | ICD-10-CM | POA: Diagnosis not present

## 2017-12-07 DIAGNOSIS — Z7984 Long term (current) use of oral hypoglycemic drugs: Secondary | ICD-10-CM | POA: Diagnosis not present

## 2017-12-07 DIAGNOSIS — E1122 Type 2 diabetes mellitus with diabetic chronic kidney disease: Secondary | ICD-10-CM | POA: Diagnosis not present

## 2017-12-07 DIAGNOSIS — I10 Essential (primary) hypertension: Secondary | ICD-10-CM | POA: Diagnosis not present

## 2017-12-14 DIAGNOSIS — Z940 Kidney transplant status: Secondary | ICD-10-CM | POA: Diagnosis not present

## 2017-12-14 DIAGNOSIS — Z87448 Personal history of other diseases of urinary system: Secondary | ICD-10-CM | POA: Diagnosis not present

## 2017-12-14 DIAGNOSIS — Z87891 Personal history of nicotine dependence: Secondary | ICD-10-CM | POA: Diagnosis not present

## 2017-12-14 DIAGNOSIS — E7800 Pure hypercholesterolemia, unspecified: Secondary | ICD-10-CM | POA: Diagnosis not present

## 2017-12-14 DIAGNOSIS — E559 Vitamin D deficiency, unspecified: Secondary | ICD-10-CM | POA: Diagnosis not present

## 2017-12-14 DIAGNOSIS — E119 Type 2 diabetes mellitus without complications: Secondary | ICD-10-CM | POA: Diagnosis not present

## 2017-12-14 DIAGNOSIS — I10 Essential (primary) hypertension: Secondary | ICD-10-CM | POA: Diagnosis not present

## 2017-12-14 DIAGNOSIS — T380X5A Adverse effect of glucocorticoids and synthetic analogues, initial encounter: Secondary | ICD-10-CM | POA: Diagnosis not present

## 2017-12-14 DIAGNOSIS — E099 Drug or chemical induced diabetes mellitus without complications: Secondary | ICD-10-CM | POA: Diagnosis not present

## 2017-12-18 ENCOUNTER — Other Ambulatory Visit: Payer: Self-pay

## 2017-12-18 ENCOUNTER — Emergency Department (HOSPITAL_BASED_OUTPATIENT_CLINIC_OR_DEPARTMENT_OTHER)
Admission: EM | Admit: 2017-12-18 | Discharge: 2017-12-18 | Disposition: A | Discharge status: Home / Self Care | Payer: Medicare Other | Attending: Emergency Medicine | Admitting: Emergency Medicine

## 2017-12-18 ENCOUNTER — Encounter (HOSPITAL_BASED_OUTPATIENT_CLINIC_OR_DEPARTMENT_OTHER): Payer: Self-pay | Admitting: Emergency Medicine

## 2017-12-18 ENCOUNTER — Emergency Department (HOSPITAL_BASED_OUTPATIENT_CLINIC_OR_DEPARTMENT_OTHER): Payer: Medicare Other

## 2017-12-18 DIAGNOSIS — Z5321 Procedure and treatment not carried out due to patient leaving prior to being seen by health care provider: Secondary | ICD-10-CM | POA: Diagnosis not present

## 2017-12-18 DIAGNOSIS — S299XXA Unspecified injury of thorax, initial encounter: Secondary | ICD-10-CM | POA: Diagnosis not present

## 2017-12-18 DIAGNOSIS — R0789 Other chest pain: Secondary | ICD-10-CM | POA: Insufficient documentation

## 2017-12-18 NOTE — ED Notes (Signed)
Pts family member is upset with long wait time. Wants me to call her MD and give him the results of her xray. Told I am not allowed to do that. 3 more people are ahead of her if no acute emergencies come in. Pt was told her MD can see the results of her xray in echart if that helps her. Pt is calm and reasonable.

## 2017-12-18 NOTE — ED Triage Notes (Signed)
Patient states that she fell this am in the tub when she slipped on some soap  - the patient states that she is having left rib pain under her breast

## 2017-12-21 DIAGNOSIS — R0789 Other chest pain: Secondary | ICD-10-CM | POA: Diagnosis not present

## 2017-12-21 DIAGNOSIS — R05 Cough: Secondary | ICD-10-CM | POA: Diagnosis not present

## 2017-12-26 DIAGNOSIS — N183 Chronic kidney disease, stage 3 (moderate): Secondary | ICD-10-CM | POA: Diagnosis not present

## 2017-12-26 DIAGNOSIS — F4321 Adjustment disorder with depressed mood: Secondary | ICD-10-CM | POA: Diagnosis not present

## 2017-12-26 DIAGNOSIS — E1122 Type 2 diabetes mellitus with diabetic chronic kidney disease: Secondary | ICD-10-CM | POA: Diagnosis not present

## 2017-12-26 DIAGNOSIS — I10 Essential (primary) hypertension: Secondary | ICD-10-CM | POA: Diagnosis not present

## 2017-12-26 DIAGNOSIS — E785 Hyperlipidemia, unspecified: Secondary | ICD-10-CM | POA: Diagnosis not present

## 2018-01-03 DIAGNOSIS — N189 Chronic kidney disease, unspecified: Secondary | ICD-10-CM | POA: Diagnosis not present

## 2018-01-03 DIAGNOSIS — Z940 Kidney transplant status: Secondary | ICD-10-CM | POA: Diagnosis not present

## 2018-01-03 DIAGNOSIS — B259 Cytomegaloviral disease, unspecified: Secondary | ICD-10-CM | POA: Diagnosis not present

## 2018-03-14 DIAGNOSIS — D899 Disorder involving the immune mechanism, unspecified: Secondary | ICD-10-CM | POA: Diagnosis not present

## 2018-03-14 DIAGNOSIS — D631 Anemia in chronic kidney disease: Secondary | ICD-10-CM | POA: Diagnosis not present

## 2018-03-14 DIAGNOSIS — I4891 Unspecified atrial fibrillation: Secondary | ICD-10-CM | POA: Diagnosis not present

## 2018-03-14 DIAGNOSIS — Z940 Kidney transplant status: Secondary | ICD-10-CM | POA: Diagnosis not present

## 2018-03-14 DIAGNOSIS — E1129 Type 2 diabetes mellitus with other diabetic kidney complication: Secondary | ICD-10-CM | POA: Diagnosis not present

## 2018-03-14 DIAGNOSIS — Q613 Polycystic kidney, unspecified: Secondary | ICD-10-CM | POA: Diagnosis not present

## 2018-03-14 DIAGNOSIS — N189 Chronic kidney disease, unspecified: Secondary | ICD-10-CM | POA: Diagnosis not present

## 2018-03-14 DIAGNOSIS — E669 Obesity, unspecified: Secondary | ICD-10-CM | POA: Diagnosis not present

## 2018-03-14 DIAGNOSIS — I129 Hypertensive chronic kidney disease with stage 1 through stage 4 chronic kidney disease, or unspecified chronic kidney disease: Secondary | ICD-10-CM | POA: Diagnosis not present

## 2018-04-06 DIAGNOSIS — Z940 Kidney transplant status: Secondary | ICD-10-CM | POA: Diagnosis not present

## 2018-04-25 DIAGNOSIS — Q613 Polycystic kidney, unspecified: Secondary | ICD-10-CM | POA: Diagnosis not present

## 2018-04-25 DIAGNOSIS — I129 Hypertensive chronic kidney disease with stage 1 through stage 4 chronic kidney disease, or unspecified chronic kidney disease: Secondary | ICD-10-CM | POA: Diagnosis not present

## 2018-04-25 DIAGNOSIS — E669 Obesity, unspecified: Secondary | ICD-10-CM | POA: Diagnosis not present

## 2018-04-25 DIAGNOSIS — E1129 Type 2 diabetes mellitus with other diabetic kidney complication: Secondary | ICD-10-CM | POA: Diagnosis not present

## 2018-04-25 DIAGNOSIS — I4891 Unspecified atrial fibrillation: Secondary | ICD-10-CM | POA: Diagnosis not present

## 2018-04-25 DIAGNOSIS — N189 Chronic kidney disease, unspecified: Secondary | ICD-10-CM | POA: Diagnosis not present

## 2018-04-25 DIAGNOSIS — D899 Disorder involving the immune mechanism, unspecified: Secondary | ICD-10-CM | POA: Diagnosis not present

## 2018-04-25 DIAGNOSIS — Z940 Kidney transplant status: Secondary | ICD-10-CM | POA: Diagnosis not present

## 2018-04-25 DIAGNOSIS — D631 Anemia in chronic kidney disease: Secondary | ICD-10-CM | POA: Diagnosis not present

## 2018-05-01 DIAGNOSIS — D899 Disorder involving the immune mechanism, unspecified: Secondary | ICD-10-CM | POA: Diagnosis not present

## 2018-05-17 DIAGNOSIS — T380X5A Adverse effect of glucocorticoids and synthetic analogues, initial encounter: Secondary | ICD-10-CM | POA: Diagnosis not present

## 2018-05-17 DIAGNOSIS — E099 Drug or chemical induced diabetes mellitus without complications: Secondary | ICD-10-CM | POA: Diagnosis not present

## 2018-05-17 DIAGNOSIS — E7800 Pure hypercholesterolemia, unspecified: Secondary | ICD-10-CM | POA: Diagnosis not present

## 2018-05-24 DIAGNOSIS — Z23 Encounter for immunization: Secondary | ICD-10-CM | POA: Diagnosis not present

## 2018-05-24 DIAGNOSIS — E099 Drug or chemical induced diabetes mellitus without complications: Secondary | ICD-10-CM | POA: Diagnosis not present

## 2018-05-24 DIAGNOSIS — Z79899 Other long term (current) drug therapy: Secondary | ICD-10-CM | POA: Diagnosis not present

## 2018-05-24 DIAGNOSIS — T380X5A Adverse effect of glucocorticoids and synthetic analogues, initial encounter: Secondary | ICD-10-CM | POA: Diagnosis not present

## 2018-05-24 DIAGNOSIS — Z940 Kidney transplant status: Secondary | ICD-10-CM | POA: Diagnosis not present

## 2018-05-24 DIAGNOSIS — I10 Essential (primary) hypertension: Secondary | ICD-10-CM | POA: Diagnosis not present

## 2018-05-24 DIAGNOSIS — E559 Vitamin D deficiency, unspecified: Secondary | ICD-10-CM | POA: Diagnosis not present

## 2018-05-24 DIAGNOSIS — E7800 Pure hypercholesterolemia, unspecified: Secondary | ICD-10-CM | POA: Diagnosis not present

## 2018-06-13 DIAGNOSIS — E785 Hyperlipidemia, unspecified: Secondary | ICD-10-CM | POA: Diagnosis not present

## 2018-06-13 DIAGNOSIS — F4321 Adjustment disorder with depressed mood: Secondary | ICD-10-CM | POA: Diagnosis not present

## 2018-06-13 DIAGNOSIS — I10 Essential (primary) hypertension: Secondary | ICD-10-CM | POA: Diagnosis not present

## 2018-06-13 DIAGNOSIS — E1122 Type 2 diabetes mellitus with diabetic chronic kidney disease: Secondary | ICD-10-CM | POA: Diagnosis not present

## 2018-06-13 DIAGNOSIS — E099 Drug or chemical induced diabetes mellitus without complications: Secondary | ICD-10-CM | POA: Diagnosis not present

## 2018-06-13 DIAGNOSIS — Z7984 Long term (current) use of oral hypoglycemic drugs: Secondary | ICD-10-CM | POA: Diagnosis not present

## 2018-06-13 DIAGNOSIS — N183 Chronic kidney disease, stage 3 (moderate): Secondary | ICD-10-CM | POA: Diagnosis not present

## 2018-06-18 DIAGNOSIS — Z299 Encounter for prophylactic measures, unspecified: Secondary | ICD-10-CM | POA: Diagnosis not present

## 2018-06-18 DIAGNOSIS — Z940 Kidney transplant status: Secondary | ICD-10-CM | POA: Diagnosis not present

## 2018-06-18 DIAGNOSIS — Z79899 Other long term (current) drug therapy: Secondary | ICD-10-CM | POA: Diagnosis not present

## 2018-06-18 DIAGNOSIS — I10 Essential (primary) hypertension: Secondary | ICD-10-CM | POA: Diagnosis not present

## 2018-07-17 DIAGNOSIS — I129 Hypertensive chronic kidney disease with stage 1 through stage 4 chronic kidney disease, or unspecified chronic kidney disease: Secondary | ICD-10-CM | POA: Diagnosis not present

## 2018-07-17 DIAGNOSIS — Z940 Kidney transplant status: Secondary | ICD-10-CM | POA: Diagnosis not present

## 2018-08-02 DIAGNOSIS — E785 Hyperlipidemia, unspecified: Secondary | ICD-10-CM | POA: Diagnosis not present

## 2018-08-02 DIAGNOSIS — F4321 Adjustment disorder with depressed mood: Secondary | ICD-10-CM | POA: Diagnosis not present

## 2018-08-02 DIAGNOSIS — N183 Chronic kidney disease, stage 3 (moderate): Secondary | ICD-10-CM | POA: Diagnosis not present

## 2018-08-02 DIAGNOSIS — E1122 Type 2 diabetes mellitus with diabetic chronic kidney disease: Secondary | ICD-10-CM | POA: Diagnosis not present

## 2018-08-02 DIAGNOSIS — Z7984 Long term (current) use of oral hypoglycemic drugs: Secondary | ICD-10-CM | POA: Diagnosis not present

## 2018-08-02 DIAGNOSIS — I10 Essential (primary) hypertension: Secondary | ICD-10-CM | POA: Diagnosis not present

## 2018-08-02 DIAGNOSIS — E099 Drug or chemical induced diabetes mellitus without complications: Secondary | ICD-10-CM | POA: Diagnosis not present

## 2018-08-24 DIAGNOSIS — T380X5A Adverse effect of glucocorticoids and synthetic analogues, initial encounter: Secondary | ICD-10-CM | POA: Diagnosis not present

## 2018-08-24 DIAGNOSIS — E7800 Pure hypercholesterolemia, unspecified: Secondary | ICD-10-CM | POA: Diagnosis not present

## 2018-08-24 DIAGNOSIS — E099 Drug or chemical induced diabetes mellitus without complications: Secondary | ICD-10-CM | POA: Diagnosis not present

## 2018-08-30 DIAGNOSIS — E7800 Pure hypercholesterolemia, unspecified: Secondary | ICD-10-CM | POA: Diagnosis not present

## 2018-08-30 DIAGNOSIS — T380X5A Adverse effect of glucocorticoids and synthetic analogues, initial encounter: Secondary | ICD-10-CM | POA: Diagnosis not present

## 2018-08-30 DIAGNOSIS — E559 Vitamin D deficiency, unspecified: Secondary | ICD-10-CM | POA: Diagnosis not present

## 2018-08-30 DIAGNOSIS — I10 Essential (primary) hypertension: Secondary | ICD-10-CM | POA: Diagnosis not present

## 2018-08-30 DIAGNOSIS — E099 Drug or chemical induced diabetes mellitus without complications: Secondary | ICD-10-CM | POA: Diagnosis not present

## 2018-08-30 DIAGNOSIS — Z940 Kidney transplant status: Secondary | ICD-10-CM | POA: Diagnosis not present

## 2018-09-07 DIAGNOSIS — D899 Disorder involving the immune mechanism, unspecified: Secondary | ICD-10-CM | POA: Diagnosis not present

## 2018-09-07 DIAGNOSIS — D631 Anemia in chronic kidney disease: Secondary | ICD-10-CM | POA: Diagnosis not present

## 2018-09-07 DIAGNOSIS — E669 Obesity, unspecified: Secondary | ICD-10-CM | POA: Diagnosis not present

## 2018-09-07 DIAGNOSIS — I4891 Unspecified atrial fibrillation: Secondary | ICD-10-CM | POA: Diagnosis not present

## 2018-09-07 DIAGNOSIS — I129 Hypertensive chronic kidney disease with stage 1 through stage 4 chronic kidney disease, or unspecified chronic kidney disease: Secondary | ICD-10-CM | POA: Diagnosis not present

## 2018-09-07 DIAGNOSIS — Q613 Polycystic kidney, unspecified: Secondary | ICD-10-CM | POA: Diagnosis not present

## 2018-09-07 DIAGNOSIS — Z940 Kidney transplant status: Secondary | ICD-10-CM | POA: Diagnosis not present

## 2018-09-07 DIAGNOSIS — N189 Chronic kidney disease, unspecified: Secondary | ICD-10-CM | POA: Diagnosis not present

## 2018-09-07 DIAGNOSIS — E1129 Type 2 diabetes mellitus with other diabetic kidney complication: Secondary | ICD-10-CM | POA: Diagnosis not present

## 2018-10-08 DIAGNOSIS — T380X5A Adverse effect of glucocorticoids and synthetic analogues, initial encounter: Secondary | ICD-10-CM | POA: Diagnosis not present

## 2018-10-08 DIAGNOSIS — E099 Drug or chemical induced diabetes mellitus without complications: Secondary | ICD-10-CM | POA: Diagnosis not present

## 2018-10-25 ENCOUNTER — Other Ambulatory Visit: Payer: Self-pay | Admitting: Internal Medicine

## 2018-10-25 DIAGNOSIS — Z1231 Encounter for screening mammogram for malignant neoplasm of breast: Secondary | ICD-10-CM

## 2018-10-29 ENCOUNTER — Ambulatory Visit
Admission: RE | Admit: 2018-10-29 | Discharge: 2018-10-29 | Disposition: A | Discharge status: Home / Self Care | Payer: Medicare Other | Source: Ambulatory Visit | Attending: Internal Medicine | Admitting: Internal Medicine

## 2018-10-29 ENCOUNTER — Other Ambulatory Visit: Payer: Self-pay

## 2018-10-29 DIAGNOSIS — Z1231 Encounter for screening mammogram for malignant neoplasm of breast: Secondary | ICD-10-CM

## 2018-10-29 DIAGNOSIS — Z940 Kidney transplant status: Secondary | ICD-10-CM | POA: Diagnosis not present

## 2018-10-29 DIAGNOSIS — I129 Hypertensive chronic kidney disease with stage 1 through stage 4 chronic kidney disease, or unspecified chronic kidney disease: Secondary | ICD-10-CM | POA: Diagnosis not present

## 2018-12-04 DIAGNOSIS — E7800 Pure hypercholesterolemia, unspecified: Secondary | ICD-10-CM | POA: Diagnosis not present

## 2018-12-04 DIAGNOSIS — T380X5A Adverse effect of glucocorticoids and synthetic analogues, initial encounter: Secondary | ICD-10-CM | POA: Diagnosis not present

## 2018-12-04 DIAGNOSIS — E099 Drug or chemical induced diabetes mellitus without complications: Secondary | ICD-10-CM | POA: Diagnosis not present

## 2018-12-06 DIAGNOSIS — Z940 Kidney transplant status: Secondary | ICD-10-CM | POA: Diagnosis not present

## 2018-12-06 DIAGNOSIS — E559 Vitamin D deficiency, unspecified: Secondary | ICD-10-CM | POA: Diagnosis not present

## 2018-12-06 DIAGNOSIS — E119 Type 2 diabetes mellitus without complications: Secondary | ICD-10-CM | POA: Diagnosis not present

## 2018-12-06 DIAGNOSIS — I10 Essential (primary) hypertension: Secondary | ICD-10-CM | POA: Diagnosis not present

## 2018-12-06 DIAGNOSIS — E7800 Pure hypercholesterolemia, unspecified: Secondary | ICD-10-CM | POA: Diagnosis not present

## 2018-12-06 DIAGNOSIS — T380X5A Adverse effect of glucocorticoids and synthetic analogues, initial encounter: Secondary | ICD-10-CM | POA: Diagnosis not present

## 2019-01-09 DIAGNOSIS — Z940 Kidney transplant status: Secondary | ICD-10-CM | POA: Diagnosis not present

## 2019-01-09 DIAGNOSIS — Q613 Polycystic kidney, unspecified: Secondary | ICD-10-CM | POA: Diagnosis not present

## 2019-01-09 DIAGNOSIS — N189 Chronic kidney disease, unspecified: Secondary | ICD-10-CM | POA: Diagnosis not present

## 2019-01-09 DIAGNOSIS — D631 Anemia in chronic kidney disease: Secondary | ICD-10-CM | POA: Diagnosis not present

## 2019-01-09 DIAGNOSIS — I4891 Unspecified atrial fibrillation: Secondary | ICD-10-CM | POA: Diagnosis not present

## 2019-01-09 DIAGNOSIS — I129 Hypertensive chronic kidney disease with stage 1 through stage 4 chronic kidney disease, or unspecified chronic kidney disease: Secondary | ICD-10-CM | POA: Diagnosis not present

## 2019-01-09 DIAGNOSIS — E669 Obesity, unspecified: Secondary | ICD-10-CM | POA: Diagnosis not present

## 2019-01-09 DIAGNOSIS — E1129 Type 2 diabetes mellitus with other diabetic kidney complication: Secondary | ICD-10-CM | POA: Diagnosis not present

## 2019-01-09 DIAGNOSIS — D899 Disorder involving the immune mechanism, unspecified: Secondary | ICD-10-CM | POA: Diagnosis not present

## 2019-01-17 DIAGNOSIS — F4321 Adjustment disorder with depressed mood: Secondary | ICD-10-CM | POA: Diagnosis not present

## 2019-01-17 DIAGNOSIS — E1122 Type 2 diabetes mellitus with diabetic chronic kidney disease: Secondary | ICD-10-CM | POA: Diagnosis not present

## 2019-01-17 DIAGNOSIS — I10 Essential (primary) hypertension: Secondary | ICD-10-CM | POA: Diagnosis not present

## 2019-01-17 DIAGNOSIS — E785 Hyperlipidemia, unspecified: Secondary | ICD-10-CM | POA: Diagnosis not present

## 2019-01-17 DIAGNOSIS — N183 Chronic kidney disease, stage 3 (moderate): Secondary | ICD-10-CM | POA: Diagnosis not present

## 2019-01-17 DIAGNOSIS — E099 Drug or chemical induced diabetes mellitus without complications: Secondary | ICD-10-CM | POA: Diagnosis not present

## 2019-01-17 DIAGNOSIS — E7800 Pure hypercholesterolemia, unspecified: Secondary | ICD-10-CM | POA: Diagnosis not present

## 2019-01-28 DIAGNOSIS — Z940 Kidney transplant status: Secondary | ICD-10-CM | POA: Diagnosis not present

## 2019-01-28 DIAGNOSIS — E1122 Type 2 diabetes mellitus with diabetic chronic kidney disease: Secondary | ICD-10-CM | POA: Diagnosis not present

## 2019-01-28 DIAGNOSIS — N183 Chronic kidney disease, stage 3 (moderate): Secondary | ICD-10-CM | POA: Diagnosis not present

## 2019-01-28 DIAGNOSIS — I10 Essential (primary) hypertension: Secondary | ICD-10-CM | POA: Diagnosis not present

## 2019-03-07 DIAGNOSIS — T380X5A Adverse effect of glucocorticoids and synthetic analogues, initial encounter: Secondary | ICD-10-CM | POA: Diagnosis not present

## 2019-03-07 DIAGNOSIS — Z940 Kidney transplant status: Secondary | ICD-10-CM | POA: Diagnosis not present

## 2019-03-07 DIAGNOSIS — E7800 Pure hypercholesterolemia, unspecified: Secondary | ICD-10-CM | POA: Diagnosis not present

## 2019-03-07 DIAGNOSIS — E099 Drug or chemical induced diabetes mellitus without complications: Secondary | ICD-10-CM | POA: Diagnosis not present

## 2019-03-14 DIAGNOSIS — I10 Essential (primary) hypertension: Secondary | ICD-10-CM | POA: Diagnosis not present

## 2019-03-14 DIAGNOSIS — E559 Vitamin D deficiency, unspecified: Secondary | ICD-10-CM | POA: Diagnosis not present

## 2019-03-14 DIAGNOSIS — E7800 Pure hypercholesterolemia, unspecified: Secondary | ICD-10-CM | POA: Diagnosis not present

## 2019-03-14 DIAGNOSIS — T380X5D Adverse effect of glucocorticoids and synthetic analogues, subsequent encounter: Secondary | ICD-10-CM | POA: Diagnosis not present

## 2019-03-14 DIAGNOSIS — E099 Drug or chemical induced diabetes mellitus without complications: Secondary | ICD-10-CM | POA: Diagnosis not present

## 2019-03-14 DIAGNOSIS — Z940 Kidney transplant status: Secondary | ICD-10-CM | POA: Diagnosis not present

## 2019-03-22 DIAGNOSIS — G4700 Insomnia, unspecified: Secondary | ICD-10-CM | POA: Diagnosis not present

## 2019-03-22 DIAGNOSIS — F4321 Adjustment disorder with depressed mood: Secondary | ICD-10-CM | POA: Diagnosis not present

## 2019-04-01 DIAGNOSIS — E785 Hyperlipidemia, unspecified: Secondary | ICD-10-CM | POA: Diagnosis not present

## 2019-04-01 DIAGNOSIS — I10 Essential (primary) hypertension: Secondary | ICD-10-CM | POA: Diagnosis not present

## 2019-04-01 DIAGNOSIS — E1122 Type 2 diabetes mellitus with diabetic chronic kidney disease: Secondary | ICD-10-CM | POA: Diagnosis not present

## 2019-04-01 DIAGNOSIS — E7800 Pure hypercholesterolemia, unspecified: Secondary | ICD-10-CM | POA: Diagnosis not present

## 2019-04-01 DIAGNOSIS — N183 Chronic kidney disease, stage 3 (moderate): Secondary | ICD-10-CM | POA: Diagnosis not present

## 2019-04-01 DIAGNOSIS — E099 Drug or chemical induced diabetes mellitus without complications: Secondary | ICD-10-CM | POA: Diagnosis not present

## 2019-04-01 DIAGNOSIS — F4321 Adjustment disorder with depressed mood: Secondary | ICD-10-CM | POA: Diagnosis not present

## 2019-04-17 DIAGNOSIS — Z940 Kidney transplant status: Secondary | ICD-10-CM | POA: Diagnosis not present

## 2019-04-24 DIAGNOSIS — L918 Other hypertrophic disorders of the skin: Secondary | ICD-10-CM | POA: Diagnosis not present

## 2019-04-24 DIAGNOSIS — L821 Other seborrheic keratosis: Secondary | ICD-10-CM | POA: Diagnosis not present

## 2019-04-24 DIAGNOSIS — D3610 Benign neoplasm of peripheral nerves and autonomic nervous system, unspecified: Secondary | ICD-10-CM | POA: Diagnosis not present

## 2019-05-02 DIAGNOSIS — E099 Drug or chemical induced diabetes mellitus without complications: Secondary | ICD-10-CM | POA: Diagnosis not present

## 2019-05-02 DIAGNOSIS — E785 Hyperlipidemia, unspecified: Secondary | ICD-10-CM | POA: Diagnosis not present

## 2019-05-02 DIAGNOSIS — I10 Essential (primary) hypertension: Secondary | ICD-10-CM | POA: Diagnosis not present

## 2019-05-02 DIAGNOSIS — F4321 Adjustment disorder with depressed mood: Secondary | ICD-10-CM | POA: Diagnosis not present

## 2019-05-02 DIAGNOSIS — E7800 Pure hypercholesterolemia, unspecified: Secondary | ICD-10-CM | POA: Diagnosis not present

## 2019-05-02 DIAGNOSIS — E1122 Type 2 diabetes mellitus with diabetic chronic kidney disease: Secondary | ICD-10-CM | POA: Diagnosis not present

## 2019-05-02 DIAGNOSIS — N183 Chronic kidney disease, stage 3 (moderate): Secondary | ICD-10-CM | POA: Diagnosis not present

## 2019-05-09 DIAGNOSIS — I4891 Unspecified atrial fibrillation: Secondary | ICD-10-CM | POA: Diagnosis not present

## 2019-05-09 DIAGNOSIS — E669 Obesity, unspecified: Secondary | ICD-10-CM | POA: Diagnosis not present

## 2019-05-09 DIAGNOSIS — Q613 Polycystic kidney, unspecified: Secondary | ICD-10-CM | POA: Diagnosis not present

## 2019-05-09 DIAGNOSIS — N189 Chronic kidney disease, unspecified: Secondary | ICD-10-CM | POA: Diagnosis not present

## 2019-05-09 DIAGNOSIS — Z940 Kidney transplant status: Secondary | ICD-10-CM | POA: Diagnosis not present

## 2019-05-09 DIAGNOSIS — E1129 Type 2 diabetes mellitus with other diabetic kidney complication: Secondary | ICD-10-CM | POA: Diagnosis not present

## 2019-05-09 DIAGNOSIS — D631 Anemia in chronic kidney disease: Secondary | ICD-10-CM | POA: Diagnosis not present

## 2019-05-09 DIAGNOSIS — Z23 Encounter for immunization: Secondary | ICD-10-CM | POA: Diagnosis not present

## 2019-05-09 DIAGNOSIS — I129 Hypertensive chronic kidney disease with stage 1 through stage 4 chronic kidney disease, or unspecified chronic kidney disease: Secondary | ICD-10-CM | POA: Diagnosis not present

## 2019-07-25 DIAGNOSIS — Z940 Kidney transplant status: Secondary | ICD-10-CM | POA: Diagnosis not present

## 2019-07-25 DIAGNOSIS — I129 Hypertensive chronic kidney disease with stage 1 through stage 4 chronic kidney disease, or unspecified chronic kidney disease: Secondary | ICD-10-CM | POA: Diagnosis not present

## 2019-08-22 DIAGNOSIS — Z20828 Contact with and (suspected) exposure to other viral communicable diseases: Secondary | ICD-10-CM | POA: Diagnosis not present

## 2019-08-22 DIAGNOSIS — T380X5A Adverse effect of glucocorticoids and synthetic analogues, initial encounter: Secondary | ICD-10-CM | POA: Diagnosis not present

## 2019-08-22 DIAGNOSIS — E7800 Pure hypercholesterolemia, unspecified: Secondary | ICD-10-CM | POA: Diagnosis not present

## 2019-08-22 DIAGNOSIS — T380X5D Adverse effect of glucocorticoids and synthetic analogues, subsequent encounter: Secondary | ICD-10-CM | POA: Diagnosis not present

## 2019-08-22 DIAGNOSIS — E099 Drug or chemical induced diabetes mellitus without complications: Secondary | ICD-10-CM | POA: Diagnosis not present

## 2019-08-28 DIAGNOSIS — E099 Drug or chemical induced diabetes mellitus without complications: Secondary | ICD-10-CM | POA: Diagnosis not present

## 2019-08-28 DIAGNOSIS — T380X5D Adverse effect of glucocorticoids and synthetic analogues, subsequent encounter: Secondary | ICD-10-CM | POA: Diagnosis not present

## 2019-08-28 DIAGNOSIS — E559 Vitamin D deficiency, unspecified: Secondary | ICD-10-CM | POA: Diagnosis not present

## 2019-08-28 DIAGNOSIS — I10 Essential (primary) hypertension: Secondary | ICD-10-CM | POA: Diagnosis not present

## 2019-08-28 DIAGNOSIS — Z940 Kidney transplant status: Secondary | ICD-10-CM | POA: Diagnosis not present

## 2019-08-28 DIAGNOSIS — E7800 Pure hypercholesterolemia, unspecified: Secondary | ICD-10-CM | POA: Diagnosis not present

## 2019-10-10 DIAGNOSIS — Z23 Encounter for immunization: Secondary | ICD-10-CM | POA: Diagnosis not present

## 2019-10-15 DIAGNOSIS — Z940 Kidney transplant status: Secondary | ICD-10-CM | POA: Diagnosis not present

## 2019-10-15 DIAGNOSIS — Q613 Polycystic kidney, unspecified: Secondary | ICD-10-CM | POA: Diagnosis not present

## 2019-10-15 DIAGNOSIS — I4891 Unspecified atrial fibrillation: Secondary | ICD-10-CM | POA: Diagnosis not present

## 2019-10-15 DIAGNOSIS — E669 Obesity, unspecified: Secondary | ICD-10-CM | POA: Diagnosis not present

## 2019-10-15 DIAGNOSIS — I129 Hypertensive chronic kidney disease with stage 1 through stage 4 chronic kidney disease, or unspecified chronic kidney disease: Secondary | ICD-10-CM | POA: Diagnosis not present

## 2019-10-15 DIAGNOSIS — N189 Chronic kidney disease, unspecified: Secondary | ICD-10-CM | POA: Diagnosis not present

## 2019-10-15 DIAGNOSIS — D631 Anemia in chronic kidney disease: Secondary | ICD-10-CM | POA: Diagnosis not present

## 2019-10-15 DIAGNOSIS — D849 Immunodeficiency, unspecified: Secondary | ICD-10-CM | POA: Diagnosis not present

## 2019-10-15 DIAGNOSIS — E1129 Type 2 diabetes mellitus with other diabetic kidney complication: Secondary | ICD-10-CM | POA: Diagnosis not present

## 2019-11-07 DIAGNOSIS — Z23 Encounter for immunization: Secondary | ICD-10-CM | POA: Diagnosis not present

## 2019-12-02 ENCOUNTER — Other Ambulatory Visit: Payer: Self-pay | Admitting: Internal Medicine

## 2019-12-02 DIAGNOSIS — Z1231 Encounter for screening mammogram for malignant neoplasm of breast: Secondary | ICD-10-CM

## 2019-12-19 ENCOUNTER — Ambulatory Visit
Admission: RE | Admit: 2019-12-19 | Discharge: 2019-12-19 | Disposition: A | Discharge status: Home / Self Care | Payer: Medicare Other | Source: Ambulatory Visit | Attending: Internal Medicine | Admitting: Internal Medicine

## 2019-12-19 ENCOUNTER — Other Ambulatory Visit: Payer: Self-pay

## 2019-12-19 DIAGNOSIS — Z1231 Encounter for screening mammogram for malignant neoplasm of breast: Secondary | ICD-10-CM

## 2019-12-19 DIAGNOSIS — T380X5D Adverse effect of glucocorticoids and synthetic analogues, subsequent encounter: Secondary | ICD-10-CM | POA: Diagnosis not present

## 2019-12-19 DIAGNOSIS — E099 Drug or chemical induced diabetes mellitus without complications: Secondary | ICD-10-CM | POA: Diagnosis not present

## 2019-12-19 DIAGNOSIS — E7800 Pure hypercholesterolemia, unspecified: Secondary | ICD-10-CM | POA: Diagnosis not present

## 2019-12-20 DIAGNOSIS — E7800 Pure hypercholesterolemia, unspecified: Secondary | ICD-10-CM | POA: Diagnosis not present

## 2019-12-20 DIAGNOSIS — E099 Drug or chemical induced diabetes mellitus without complications: Secondary | ICD-10-CM | POA: Diagnosis not present

## 2019-12-20 DIAGNOSIS — E1122 Type 2 diabetes mellitus with diabetic chronic kidney disease: Secondary | ICD-10-CM | POA: Diagnosis not present

## 2019-12-20 DIAGNOSIS — F4321 Adjustment disorder with depressed mood: Secondary | ICD-10-CM | POA: Diagnosis not present

## 2019-12-20 DIAGNOSIS — N183 Chronic kidney disease, stage 3 unspecified: Secondary | ICD-10-CM | POA: Diagnosis not present

## 2019-12-20 DIAGNOSIS — I10 Essential (primary) hypertension: Secondary | ICD-10-CM | POA: Diagnosis not present

## 2019-12-20 DIAGNOSIS — E785 Hyperlipidemia, unspecified: Secondary | ICD-10-CM | POA: Diagnosis not present

## 2019-12-26 DIAGNOSIS — Z940 Kidney transplant status: Secondary | ICD-10-CM | POA: Diagnosis not present

## 2019-12-26 DIAGNOSIS — E559 Vitamin D deficiency, unspecified: Secondary | ICD-10-CM | POA: Diagnosis not present

## 2019-12-26 DIAGNOSIS — E7800 Pure hypercholesterolemia, unspecified: Secondary | ICD-10-CM | POA: Diagnosis not present

## 2019-12-26 DIAGNOSIS — I10 Essential (primary) hypertension: Secondary | ICD-10-CM | POA: Diagnosis not present

## 2019-12-26 DIAGNOSIS — T380X5D Adverse effect of glucocorticoids and synthetic analogues, subsequent encounter: Secondary | ICD-10-CM | POA: Diagnosis not present

## 2019-12-26 DIAGNOSIS — E099 Drug or chemical induced diabetes mellitus without complications: Secondary | ICD-10-CM | POA: Diagnosis not present

## 2020-01-03 DIAGNOSIS — M25561 Pain in right knee: Secondary | ICD-10-CM | POA: Diagnosis not present

## 2020-01-03 DIAGNOSIS — M25562 Pain in left knee: Secondary | ICD-10-CM | POA: Diagnosis not present

## 2020-01-09 ENCOUNTER — Other Ambulatory Visit: Payer: Self-pay | Admitting: Internal Medicine

## 2020-01-09 DIAGNOSIS — I10 Essential (primary) hypertension: Secondary | ICD-10-CM | POA: Diagnosis not present

## 2020-01-09 DIAGNOSIS — E7800 Pure hypercholesterolemia, unspecified: Secondary | ICD-10-CM | POA: Diagnosis not present

## 2020-01-09 DIAGNOSIS — E2839 Other primary ovarian failure: Secondary | ICD-10-CM

## 2020-01-09 DIAGNOSIS — Z1389 Encounter for screening for other disorder: Secondary | ICD-10-CM | POA: Diagnosis not present

## 2020-01-09 DIAGNOSIS — Z940 Kidney transplant status: Secondary | ICD-10-CM | POA: Diagnosis not present

## 2020-01-09 DIAGNOSIS — Z0000 Encounter for general adult medical examination without abnormal findings: Secondary | ICD-10-CM | POA: Diagnosis not present

## 2020-01-09 DIAGNOSIS — Z7952 Long term (current) use of systemic steroids: Secondary | ICD-10-CM | POA: Diagnosis not present

## 2020-01-09 DIAGNOSIS — N1831 Chronic kidney disease, stage 3a: Secondary | ICD-10-CM | POA: Diagnosis not present

## 2020-01-09 DIAGNOSIS — E1122 Type 2 diabetes mellitus with diabetic chronic kidney disease: Secondary | ICD-10-CM | POA: Diagnosis not present

## 2020-02-24 ENCOUNTER — Other Ambulatory Visit: Payer: Self-pay | Admitting: Family Medicine

## 2020-02-24 ENCOUNTER — Other Ambulatory Visit: Payer: Self-pay | Admitting: Orthopedic Surgery

## 2020-02-24 DIAGNOSIS — M25561 Pain in right knee: Secondary | ICD-10-CM

## 2020-02-24 DIAGNOSIS — M1711 Unilateral primary osteoarthritis, right knee: Secondary | ICD-10-CM | POA: Diagnosis not present

## 2020-03-02 DIAGNOSIS — M1711 Unilateral primary osteoarthritis, right knee: Secondary | ICD-10-CM | POA: Diagnosis not present

## 2020-03-09 DIAGNOSIS — M1711 Unilateral primary osteoarthritis, right knee: Secondary | ICD-10-CM | POA: Diagnosis not present

## 2020-03-15 ENCOUNTER — Other Ambulatory Visit: Payer: Self-pay

## 2020-03-15 ENCOUNTER — Ambulatory Visit
Admission: RE | Admit: 2020-03-15 | Discharge: 2020-03-15 | Disposition: A | Discharge status: Home / Self Care | Payer: Medicare Other | Source: Ambulatory Visit | Attending: Orthopedic Surgery | Admitting: Orthopedic Surgery

## 2020-03-15 DIAGNOSIS — M25561 Pain in right knee: Secondary | ICD-10-CM

## 2020-03-20 DIAGNOSIS — S83241A Other tear of medial meniscus, current injury, right knee, initial encounter: Secondary | ICD-10-CM | POA: Diagnosis not present

## 2020-03-26 ENCOUNTER — Other Ambulatory Visit: Payer: Self-pay

## 2020-03-26 ENCOUNTER — Ambulatory Visit
Admission: RE | Admit: 2020-03-26 | Discharge: 2020-03-26 | Disposition: A | Discharge status: Home / Self Care | Payer: Medicare Other | Source: Ambulatory Visit | Attending: Internal Medicine | Admitting: Internal Medicine

## 2020-03-26 DIAGNOSIS — Z780 Asymptomatic menopausal state: Secondary | ICD-10-CM | POA: Diagnosis not present

## 2020-03-26 DIAGNOSIS — E2839 Other primary ovarian failure: Secondary | ICD-10-CM

## 2020-03-26 DIAGNOSIS — M85852 Other specified disorders of bone density and structure, left thigh: Secondary | ICD-10-CM | POA: Diagnosis not present

## 2020-04-06 DIAGNOSIS — F4321 Adjustment disorder with depressed mood: Secondary | ICD-10-CM | POA: Diagnosis not present

## 2020-04-06 DIAGNOSIS — E1122 Type 2 diabetes mellitus with diabetic chronic kidney disease: Secondary | ICD-10-CM | POA: Diagnosis not present

## 2020-04-06 DIAGNOSIS — N183 Chronic kidney disease, stage 3 unspecified: Secondary | ICD-10-CM | POA: Diagnosis not present

## 2020-04-06 DIAGNOSIS — E7800 Pure hypercholesterolemia, unspecified: Secondary | ICD-10-CM | POA: Diagnosis not present

## 2020-04-06 DIAGNOSIS — I10 Essential (primary) hypertension: Secondary | ICD-10-CM | POA: Diagnosis not present

## 2020-04-06 DIAGNOSIS — E785 Hyperlipidemia, unspecified: Secondary | ICD-10-CM | POA: Diagnosis not present

## 2020-04-06 DIAGNOSIS — E099 Drug or chemical induced diabetes mellitus without complications: Secondary | ICD-10-CM | POA: Diagnosis not present

## 2020-04-08 DIAGNOSIS — E559 Vitamin D deficiency, unspecified: Secondary | ICD-10-CM | POA: Diagnosis not present

## 2020-04-08 DIAGNOSIS — E7800 Pure hypercholesterolemia, unspecified: Secondary | ICD-10-CM | POA: Diagnosis not present

## 2020-04-08 DIAGNOSIS — E099 Drug or chemical induced diabetes mellitus without complications: Secondary | ICD-10-CM | POA: Diagnosis not present

## 2020-04-08 DIAGNOSIS — T380X5D Adverse effect of glucocorticoids and synthetic analogues, subsequent encounter: Secondary | ICD-10-CM | POA: Diagnosis not present

## 2020-04-09 DIAGNOSIS — X58XXXA Exposure to other specified factors, initial encounter: Secondary | ICD-10-CM | POA: Diagnosis not present

## 2020-04-09 DIAGNOSIS — Y999 Unspecified external cause status: Secondary | ICD-10-CM | POA: Diagnosis not present

## 2020-04-09 DIAGNOSIS — S83241A Other tear of medial meniscus, current injury, right knee, initial encounter: Secondary | ICD-10-CM | POA: Diagnosis not present

## 2020-04-09 DIAGNOSIS — S83231A Complex tear of medial meniscus, current injury, right knee, initial encounter: Secondary | ICD-10-CM | POA: Diagnosis not present

## 2020-04-17 DIAGNOSIS — Z940 Kidney transplant status: Secondary | ICD-10-CM | POA: Diagnosis not present

## 2020-04-17 DIAGNOSIS — E559 Vitamin D deficiency, unspecified: Secondary | ICD-10-CM | POA: Diagnosis not present

## 2020-04-17 DIAGNOSIS — I10 Essential (primary) hypertension: Secondary | ICD-10-CM | POA: Diagnosis not present

## 2020-04-17 DIAGNOSIS — E099 Drug or chemical induced diabetes mellitus without complications: Secondary | ICD-10-CM | POA: Diagnosis not present

## 2020-04-17 DIAGNOSIS — T380X5D Adverse effect of glucocorticoids and synthetic analogues, subsequent encounter: Secondary | ICD-10-CM | POA: Diagnosis not present

## 2020-04-17 DIAGNOSIS — Z79899 Other long term (current) drug therapy: Secondary | ICD-10-CM | POA: Diagnosis not present

## 2020-04-17 DIAGNOSIS — E7800 Pure hypercholesterolemia, unspecified: Secondary | ICD-10-CM | POA: Diagnosis not present

## 2020-04-22 DIAGNOSIS — S83241D Other tear of medial meniscus, current injury, right knee, subsequent encounter: Secondary | ICD-10-CM | POA: Diagnosis not present

## 2020-05-14 DIAGNOSIS — Z940 Kidney transplant status: Secondary | ICD-10-CM | POA: Diagnosis not present

## 2020-06-16 DIAGNOSIS — E1129 Type 2 diabetes mellitus with other diabetic kidney complication: Secondary | ICD-10-CM | POA: Diagnosis not present

## 2020-06-16 DIAGNOSIS — Z940 Kidney transplant status: Secondary | ICD-10-CM | POA: Diagnosis not present

## 2020-06-16 DIAGNOSIS — E669 Obesity, unspecified: Secondary | ICD-10-CM | POA: Diagnosis not present

## 2020-06-16 DIAGNOSIS — Q613 Polycystic kidney, unspecified: Secondary | ICD-10-CM | POA: Diagnosis not present

## 2020-06-16 DIAGNOSIS — D849 Immunodeficiency, unspecified: Secondary | ICD-10-CM | POA: Diagnosis not present

## 2020-06-16 DIAGNOSIS — I129 Hypertensive chronic kidney disease with stage 1 through stage 4 chronic kidney disease, or unspecified chronic kidney disease: Secondary | ICD-10-CM | POA: Diagnosis not present

## 2020-06-16 DIAGNOSIS — N189 Chronic kidney disease, unspecified: Secondary | ICD-10-CM | POA: Diagnosis not present

## 2020-06-16 DIAGNOSIS — D631 Anemia in chronic kidney disease: Secondary | ICD-10-CM | POA: Diagnosis not present

## 2020-06-16 DIAGNOSIS — I4891 Unspecified atrial fibrillation: Secondary | ICD-10-CM | POA: Diagnosis not present

## 2020-06-18 DIAGNOSIS — Z23 Encounter for immunization: Secondary | ICD-10-CM | POA: Diagnosis not present

## 2020-06-30 DIAGNOSIS — Z23 Encounter for immunization: Secondary | ICD-10-CM | POA: Diagnosis not present

## 2020-07-06 DIAGNOSIS — S83241D Other tear of medial meniscus, current injury, right knee, subsequent encounter: Secondary | ICD-10-CM | POA: Diagnosis not present

## 2020-07-21 DIAGNOSIS — E099 Drug or chemical induced diabetes mellitus without complications: Secondary | ICD-10-CM | POA: Diagnosis not present

## 2020-07-21 DIAGNOSIS — Z79899 Other long term (current) drug therapy: Secondary | ICD-10-CM | POA: Diagnosis not present

## 2020-07-21 DIAGNOSIS — T380X5D Adverse effect of glucocorticoids and synthetic analogues, subsequent encounter: Secondary | ICD-10-CM | POA: Diagnosis not present

## 2020-07-21 DIAGNOSIS — E7800 Pure hypercholesterolemia, unspecified: Secondary | ICD-10-CM | POA: Diagnosis not present

## 2020-07-21 DIAGNOSIS — I10 Essential (primary) hypertension: Secondary | ICD-10-CM | POA: Diagnosis not present

## 2020-07-21 DIAGNOSIS — Z940 Kidney transplant status: Secondary | ICD-10-CM | POA: Diagnosis not present

## 2020-07-24 DIAGNOSIS — I10 Essential (primary) hypertension: Secondary | ICD-10-CM | POA: Diagnosis not present

## 2020-07-24 DIAGNOSIS — E538 Deficiency of other specified B group vitamins: Secondary | ICD-10-CM | POA: Diagnosis not present

## 2020-07-24 DIAGNOSIS — Z7952 Long term (current) use of systemic steroids: Secondary | ICD-10-CM | POA: Diagnosis not present

## 2020-07-24 DIAGNOSIS — T380X5S Adverse effect of glucocorticoids and synthetic analogues, sequela: Secondary | ICD-10-CM | POA: Diagnosis not present

## 2020-07-24 DIAGNOSIS — E7800 Pure hypercholesterolemia, unspecified: Secondary | ICD-10-CM | POA: Diagnosis not present

## 2020-07-24 DIAGNOSIS — E559 Vitamin D deficiency, unspecified: Secondary | ICD-10-CM | POA: Diagnosis not present

## 2020-07-24 DIAGNOSIS — E099 Drug or chemical induced diabetes mellitus without complications: Secondary | ICD-10-CM | POA: Diagnosis not present

## 2020-07-24 DIAGNOSIS — Z7984 Long term (current) use of oral hypoglycemic drugs: Secondary | ICD-10-CM | POA: Diagnosis not present

## 2020-07-24 DIAGNOSIS — Z940 Kidney transplant status: Secondary | ICD-10-CM | POA: Diagnosis not present

## 2020-08-06 DIAGNOSIS — Z940 Kidney transplant status: Secondary | ICD-10-CM | POA: Diagnosis not present

## 2020-09-04 DIAGNOSIS — U071 COVID-19: Secondary | ICD-10-CM | POA: Diagnosis not present

## 2020-09-04 DIAGNOSIS — R059 Cough, unspecified: Secondary | ICD-10-CM | POA: Diagnosis not present

## 2020-09-09 DIAGNOSIS — J439 Emphysema, unspecified: Secondary | ICD-10-CM | POA: Diagnosis not present

## 2020-09-09 DIAGNOSIS — Z792 Long term (current) use of antibiotics: Secondary | ICD-10-CM | POA: Diagnosis not present

## 2020-09-09 DIAGNOSIS — R0902 Hypoxemia: Secondary | ICD-10-CM | POA: Diagnosis not present

## 2020-09-09 DIAGNOSIS — J939 Pneumothorax, unspecified: Secondary | ICD-10-CM | POA: Diagnosis not present

## 2020-09-09 DIAGNOSIS — Z9981 Dependence on supplemental oxygen: Secondary | ICD-10-CM | POA: Diagnosis not present

## 2020-09-09 DIAGNOSIS — R404 Transient alteration of awareness: Secondary | ICD-10-CM | POA: Diagnosis not present

## 2020-09-09 DIAGNOSIS — R791 Abnormal coagulation profile: Secondary | ICD-10-CM | POA: Diagnosis not present

## 2020-09-09 DIAGNOSIS — D649 Anemia, unspecified: Secondary | ICD-10-CM | POA: Diagnosis present

## 2020-09-09 DIAGNOSIS — Z9911 Dependence on respirator [ventilator] status: Secondary | ICD-10-CM | POA: Diagnosis not present

## 2020-09-09 DIAGNOSIS — U071 COVID-19: Secondary | ICD-10-CM | POA: Diagnosis not present

## 2020-09-09 DIAGNOSIS — K219 Gastro-esophageal reflux disease without esophagitis: Secondary | ICD-10-CM | POA: Diagnosis present

## 2020-09-09 DIAGNOSIS — E440 Moderate protein-calorie malnutrition: Secondary | ICD-10-CM | POA: Diagnosis present

## 2020-09-09 DIAGNOSIS — Z87448 Personal history of other diseases of urinary system: Secondary | ICD-10-CM | POA: Diagnosis not present

## 2020-09-09 DIAGNOSIS — Z4682 Encounter for fitting and adjustment of non-vascular catheter: Secondary | ICD-10-CM | POA: Diagnosis not present

## 2020-09-09 DIAGNOSIS — I10 Essential (primary) hypertension: Secondary | ICD-10-CM | POA: Diagnosis present

## 2020-09-09 DIAGNOSIS — E871 Hypo-osmolality and hyponatremia: Secondary | ICD-10-CM | POA: Diagnosis present

## 2020-09-09 DIAGNOSIS — R778 Other specified abnormalities of plasma proteins: Secondary | ICD-10-CM | POA: Diagnosis not present

## 2020-09-09 DIAGNOSIS — J189 Pneumonia, unspecified organism: Secondary | ICD-10-CM | POA: Diagnosis not present

## 2020-09-09 DIAGNOSIS — D8481 Immunodeficiency due to conditions classified elsewhere: Secondary | ICD-10-CM | POA: Diagnosis present

## 2020-09-09 DIAGNOSIS — E099 Drug or chemical induced diabetes mellitus without complications: Secondary | ICD-10-CM | POA: Diagnosis not present

## 2020-09-09 DIAGNOSIS — E785 Hyperlipidemia, unspecified: Secondary | ICD-10-CM | POA: Diagnosis not present

## 2020-09-09 DIAGNOSIS — J1282 Pneumonia due to coronavirus disease 2019: Secondary | ICD-10-CM | POA: Diagnosis present

## 2020-09-09 DIAGNOSIS — Z940 Kidney transplant status: Secondary | ICD-10-CM | POA: Diagnosis not present

## 2020-09-09 DIAGNOSIS — J9690 Respiratory failure, unspecified, unspecified whether with hypoxia or hypercapnia: Secondary | ICD-10-CM | POA: Diagnosis not present

## 2020-09-09 DIAGNOSIS — I340 Nonrheumatic mitral (valve) insufficiency: Secondary | ICD-10-CM | POA: Diagnosis not present

## 2020-09-09 DIAGNOSIS — E119 Type 2 diabetes mellitus without complications: Secondary | ICD-10-CM | POA: Diagnosis present

## 2020-09-09 DIAGNOSIS — Z6832 Body mass index (BMI) 32.0-32.9, adult: Secondary | ICD-10-CM | POA: Diagnosis not present

## 2020-09-09 DIAGNOSIS — I421 Obstructive hypertrophic cardiomyopathy: Secondary | ICD-10-CM | POA: Diagnosis not present

## 2020-09-09 DIAGNOSIS — E7849 Other hyperlipidemia: Secondary | ICD-10-CM | POA: Diagnosis not present

## 2020-09-09 DIAGNOSIS — E782 Mixed hyperlipidemia: Secondary | ICD-10-CM | POA: Diagnosis not present

## 2020-09-09 DIAGNOSIS — E1122 Type 2 diabetes mellitus with diabetic chronic kidney disease: Secondary | ICD-10-CM | POA: Diagnosis not present

## 2020-09-09 DIAGNOSIS — N183 Chronic kidney disease, stage 3 unspecified: Secondary | ICD-10-CM | POA: Diagnosis not present

## 2020-09-09 DIAGNOSIS — I4891 Unspecified atrial fibrillation: Secondary | ICD-10-CM | POA: Diagnosis not present

## 2020-09-09 DIAGNOSIS — Z452 Encounter for adjustment and management of vascular access device: Secondary | ICD-10-CM | POA: Diagnosis not present

## 2020-09-09 DIAGNOSIS — E876 Hypokalemia: Secondary | ICD-10-CM | POA: Diagnosis not present

## 2020-09-09 DIAGNOSIS — E872 Acidosis: Secondary | ICD-10-CM | POA: Diagnosis present

## 2020-09-09 DIAGNOSIS — J9610 Chronic respiratory failure, unspecified whether with hypoxia or hypercapnia: Secondary | ICD-10-CM | POA: Diagnosis not present

## 2020-09-09 DIAGNOSIS — D6949 Other primary thrombocytopenia: Secondary | ICD-10-CM | POA: Diagnosis not present

## 2020-09-09 DIAGNOSIS — Z9225 Personal history of immunosupression therapy: Secondary | ICD-10-CM | POA: Diagnosis not present

## 2020-09-09 DIAGNOSIS — Z79899 Other long term (current) drug therapy: Secondary | ICD-10-CM | POA: Diagnosis not present

## 2020-09-09 DIAGNOSIS — R918 Other nonspecific abnormal finding of lung field: Secondary | ICD-10-CM | POA: Diagnosis not present

## 2020-09-09 DIAGNOSIS — I70213 Atherosclerosis of native arteries of extremities with intermittent claudication, bilateral legs: Secondary | ICD-10-CM | POA: Diagnosis not present

## 2020-09-09 DIAGNOSIS — G4700 Insomnia, unspecified: Secondary | ICD-10-CM | POA: Diagnosis not present

## 2020-09-09 DIAGNOSIS — D696 Thrombocytopenia, unspecified: Secondary | ICD-10-CM | POA: Diagnosis not present

## 2020-09-09 DIAGNOSIS — F4321 Adjustment disorder with depressed mood: Secondary | ICD-10-CM | POA: Diagnosis not present

## 2020-09-09 DIAGNOSIS — I248 Other forms of acute ischemic heart disease: Secondary | ICD-10-CM | POA: Diagnosis present

## 2020-09-09 DIAGNOSIS — J982 Interstitial emphysema: Secondary | ICD-10-CM | POA: Diagnosis not present

## 2020-09-09 DIAGNOSIS — Z66 Do not resuscitate: Secondary | ICD-10-CM | POA: Diagnosis not present

## 2020-09-09 DIAGNOSIS — E860 Dehydration: Secondary | ICD-10-CM | POA: Diagnosis not present

## 2020-09-09 DIAGNOSIS — T8619 Other complication of kidney transplant: Secondary | ICD-10-CM | POA: Diagnosis not present

## 2020-09-09 DIAGNOSIS — T50904A Poisoning by unspecified drugs, medicaments and biological substances, undetermined, initial encounter: Secondary | ICD-10-CM | POA: Diagnosis not present

## 2020-09-09 DIAGNOSIS — J80 Acute respiratory distress syndrome: Secondary | ICD-10-CM | POA: Diagnosis not present

## 2020-09-09 DIAGNOSIS — J9601 Acute respiratory failure with hypoxia: Secondary | ICD-10-CM | POA: Diagnosis present

## 2020-09-09 DIAGNOSIS — E7800 Pure hypercholesterolemia, unspecified: Secondary | ICD-10-CM | POA: Diagnosis not present

## 2020-09-09 DIAGNOSIS — I9589 Other hypotension: Secondary | ICD-10-CM | POA: Diagnosis not present

## 2020-09-09 DIAGNOSIS — N289 Disorder of kidney and ureter, unspecified: Secondary | ICD-10-CM | POA: Diagnosis not present

## 2020-09-09 DIAGNOSIS — D849 Immunodeficiency, unspecified: Secondary | ICD-10-CM | POA: Diagnosis not present

## 2020-09-09 DIAGNOSIS — N170 Acute kidney failure with tubular necrosis: Secondary | ICD-10-CM | POA: Diagnosis present

## 2020-09-09 DIAGNOSIS — Z794 Long term (current) use of insulin: Secondary | ICD-10-CM | POA: Diagnosis not present

## 2020-09-09 DIAGNOSIS — I4892 Unspecified atrial flutter: Secondary | ICD-10-CM | POA: Diagnosis not present

## 2020-09-09 DIAGNOSIS — I959 Hypotension, unspecified: Secondary | ICD-10-CM | POA: Diagnosis not present

## 2020-09-09 DIAGNOSIS — T797XXA Traumatic subcutaneous emphysema, initial encounter: Secondary | ICD-10-CM | POA: Diagnosis not present

## 2020-09-09 DIAGNOSIS — Z515 Encounter for palliative care: Secondary | ICD-10-CM | POA: Diagnosis not present

## 2020-09-09 DIAGNOSIS — R0602 Shortness of breath: Secondary | ICD-10-CM | POA: Diagnosis not present

## 2020-09-09 DIAGNOSIS — R0603 Acute respiratory distress: Secondary | ICD-10-CM | POA: Diagnosis not present

## 2020-09-09 DIAGNOSIS — J9600 Acute respiratory failure, unspecified whether with hypoxia or hypercapnia: Secondary | ICD-10-CM | POA: Diagnosis not present

## 2020-09-09 DIAGNOSIS — T8182XA Emphysema (subcutaneous) resulting from a procedure, initial encounter: Secondary | ICD-10-CM | POA: Diagnosis not present

## 2020-09-09 DIAGNOSIS — N179 Acute kidney failure, unspecified: Secondary | ICD-10-CM | POA: Diagnosis not present

## 2020-09-09 DIAGNOSIS — I480 Paroxysmal atrial fibrillation: Secondary | ICD-10-CM | POA: Diagnosis not present

## 2020-09-10 DIAGNOSIS — E785 Hyperlipidemia, unspecified: Secondary | ICD-10-CM | POA: Diagnosis not present

## 2020-09-10 DIAGNOSIS — E871 Hypo-osmolality and hyponatremia: Secondary | ICD-10-CM | POA: Diagnosis not present

## 2020-09-10 DIAGNOSIS — N179 Acute kidney failure, unspecified: Secondary | ICD-10-CM | POA: Diagnosis not present

## 2020-09-10 DIAGNOSIS — J1282 Pneumonia due to coronavirus disease 2019: Secondary | ICD-10-CM | POA: Diagnosis not present

## 2020-09-10 DIAGNOSIS — Z940 Kidney transplant status: Secondary | ICD-10-CM | POA: Diagnosis not present

## 2020-09-10 DIAGNOSIS — E872 Acidosis: Secondary | ICD-10-CM | POA: Diagnosis not present

## 2020-09-10 DIAGNOSIS — I10 Essential (primary) hypertension: Secondary | ICD-10-CM | POA: Diagnosis not present

## 2020-09-10 DIAGNOSIS — N289 Disorder of kidney and ureter, unspecified: Secondary | ICD-10-CM | POA: Diagnosis not present

## 2020-09-10 DIAGNOSIS — U071 COVID-19: Secondary | ICD-10-CM | POA: Diagnosis not present

## 2020-09-10 DIAGNOSIS — E119 Type 2 diabetes mellitus without complications: Secondary | ICD-10-CM | POA: Diagnosis not present

## 2020-09-10 DIAGNOSIS — D849 Immunodeficiency, unspecified: Secondary | ICD-10-CM | POA: Diagnosis not present

## 2020-09-10 DIAGNOSIS — K219 Gastro-esophageal reflux disease without esophagitis: Secondary | ICD-10-CM | POA: Diagnosis not present

## 2020-09-10 DIAGNOSIS — J9600 Acute respiratory failure, unspecified whether with hypoxia or hypercapnia: Secondary | ICD-10-CM | POA: Diagnosis not present

## 2020-09-10 DIAGNOSIS — E860 Dehydration: Secondary | ICD-10-CM | POA: Diagnosis not present

## 2020-09-11 DIAGNOSIS — I70213 Atherosclerosis of native arteries of extremities with intermittent claudication, bilateral legs: Secondary | ICD-10-CM | POA: Diagnosis not present

## 2020-09-11 DIAGNOSIS — E785 Hyperlipidemia, unspecified: Secondary | ICD-10-CM | POA: Diagnosis not present

## 2020-09-11 DIAGNOSIS — J1282 Pneumonia due to coronavirus disease 2019: Secondary | ICD-10-CM | POA: Diagnosis not present

## 2020-09-11 DIAGNOSIS — D849 Immunodeficiency, unspecified: Secondary | ICD-10-CM | POA: Diagnosis not present

## 2020-09-11 DIAGNOSIS — E119 Type 2 diabetes mellitus without complications: Secondary | ICD-10-CM | POA: Diagnosis not present

## 2020-09-11 DIAGNOSIS — K219 Gastro-esophageal reflux disease without esophagitis: Secondary | ICD-10-CM | POA: Diagnosis not present

## 2020-09-11 DIAGNOSIS — G4700 Insomnia, unspecified: Secondary | ICD-10-CM | POA: Diagnosis not present

## 2020-09-11 DIAGNOSIS — N183 Chronic kidney disease, stage 3 unspecified: Secondary | ICD-10-CM | POA: Diagnosis not present

## 2020-09-11 DIAGNOSIS — F4321 Adjustment disorder with depressed mood: Secondary | ICD-10-CM | POA: Diagnosis not present

## 2020-09-11 DIAGNOSIS — N179 Acute kidney failure, unspecified: Secondary | ICD-10-CM | POA: Diagnosis not present

## 2020-09-11 DIAGNOSIS — J9601 Acute respiratory failure with hypoxia: Secondary | ICD-10-CM | POA: Diagnosis not present

## 2020-09-11 DIAGNOSIS — E7800 Pure hypercholesterolemia, unspecified: Secondary | ICD-10-CM | POA: Diagnosis not present

## 2020-09-11 DIAGNOSIS — E872 Acidosis: Secondary | ICD-10-CM | POA: Diagnosis not present

## 2020-09-11 DIAGNOSIS — E099 Drug or chemical induced diabetes mellitus without complications: Secondary | ICD-10-CM | POA: Diagnosis not present

## 2020-09-11 DIAGNOSIS — I10 Essential (primary) hypertension: Secondary | ICD-10-CM | POA: Diagnosis not present

## 2020-09-11 DIAGNOSIS — E1122 Type 2 diabetes mellitus with diabetic chronic kidney disease: Secondary | ICD-10-CM | POA: Diagnosis not present

## 2020-09-11 DIAGNOSIS — U071 COVID-19: Secondary | ICD-10-CM | POA: Diagnosis not present

## 2020-09-11 DIAGNOSIS — D649 Anemia, unspecified: Secondary | ICD-10-CM | POA: Diagnosis not present

## 2020-09-11 DIAGNOSIS — Z940 Kidney transplant status: Secondary | ICD-10-CM | POA: Diagnosis not present

## 2020-09-12 DIAGNOSIS — U071 COVID-19: Secondary | ICD-10-CM | POA: Diagnosis not present

## 2020-09-12 DIAGNOSIS — Z794 Long term (current) use of insulin: Secondary | ICD-10-CM | POA: Diagnosis not present

## 2020-09-12 DIAGNOSIS — E876 Hypokalemia: Secondary | ICD-10-CM | POA: Diagnosis not present

## 2020-09-12 DIAGNOSIS — E872 Acidosis: Secondary | ICD-10-CM | POA: Diagnosis not present

## 2020-09-12 DIAGNOSIS — E785 Hyperlipidemia, unspecified: Secondary | ICD-10-CM | POA: Diagnosis not present

## 2020-09-12 DIAGNOSIS — Z9981 Dependence on supplemental oxygen: Secondary | ICD-10-CM | POA: Diagnosis not present

## 2020-09-12 DIAGNOSIS — E119 Type 2 diabetes mellitus without complications: Secondary | ICD-10-CM | POA: Diagnosis not present

## 2020-09-12 DIAGNOSIS — J1282 Pneumonia due to coronavirus disease 2019: Secondary | ICD-10-CM | POA: Diagnosis not present

## 2020-09-12 DIAGNOSIS — D649 Anemia, unspecified: Secondary | ICD-10-CM | POA: Diagnosis not present

## 2020-09-12 DIAGNOSIS — I10 Essential (primary) hypertension: Secondary | ICD-10-CM | POA: Diagnosis not present

## 2020-09-12 DIAGNOSIS — Z79899 Other long term (current) drug therapy: Secondary | ICD-10-CM | POA: Diagnosis not present

## 2020-09-12 DIAGNOSIS — K219 Gastro-esophageal reflux disease without esophagitis: Secondary | ICD-10-CM | POA: Diagnosis not present

## 2020-09-13 DIAGNOSIS — I10 Essential (primary) hypertension: Secondary | ICD-10-CM | POA: Diagnosis not present

## 2020-09-13 DIAGNOSIS — J982 Interstitial emphysema: Secondary | ICD-10-CM | POA: Diagnosis not present

## 2020-09-13 DIAGNOSIS — E872 Acidosis: Secondary | ICD-10-CM | POA: Diagnosis not present

## 2020-09-13 DIAGNOSIS — D649 Anemia, unspecified: Secondary | ICD-10-CM | POA: Diagnosis not present

## 2020-09-13 DIAGNOSIS — K219 Gastro-esophageal reflux disease without esophagitis: Secondary | ICD-10-CM | POA: Diagnosis not present

## 2020-09-13 DIAGNOSIS — E119 Type 2 diabetes mellitus without complications: Secondary | ICD-10-CM | POA: Diagnosis not present

## 2020-09-13 DIAGNOSIS — U071 COVID-19: Secondary | ICD-10-CM | POA: Diagnosis not present

## 2020-09-13 DIAGNOSIS — J1282 Pneumonia due to coronavirus disease 2019: Secondary | ICD-10-CM | POA: Diagnosis not present

## 2020-09-13 DIAGNOSIS — Z452 Encounter for adjustment and management of vascular access device: Secondary | ICD-10-CM | POA: Diagnosis not present

## 2020-09-13 DIAGNOSIS — E876 Hypokalemia: Secondary | ICD-10-CM | POA: Diagnosis not present

## 2020-09-13 DIAGNOSIS — Z4682 Encounter for fitting and adjustment of non-vascular catheter: Secondary | ICD-10-CM | POA: Diagnosis not present

## 2020-09-13 DIAGNOSIS — D849 Immunodeficiency, unspecified: Secondary | ICD-10-CM | POA: Diagnosis not present

## 2020-09-13 DIAGNOSIS — E785 Hyperlipidemia, unspecified: Secondary | ICD-10-CM | POA: Diagnosis not present

## 2020-09-13 DIAGNOSIS — R0902 Hypoxemia: Secondary | ICD-10-CM | POA: Diagnosis not present

## 2020-09-13 IMAGING — MG DIGITAL SCREENING BILAT W/ TOMO W/ CAD
6 of 10 series · 6 of 30 positions shown · non-contrast
Comparison: Previous exam(s).

CLINICAL DATA: Screening.

EXAM:
DIGITAL SCREENING BILATERAL MAMMOGRAM WITH TOMO AND CAD

[L CC synth-2D]
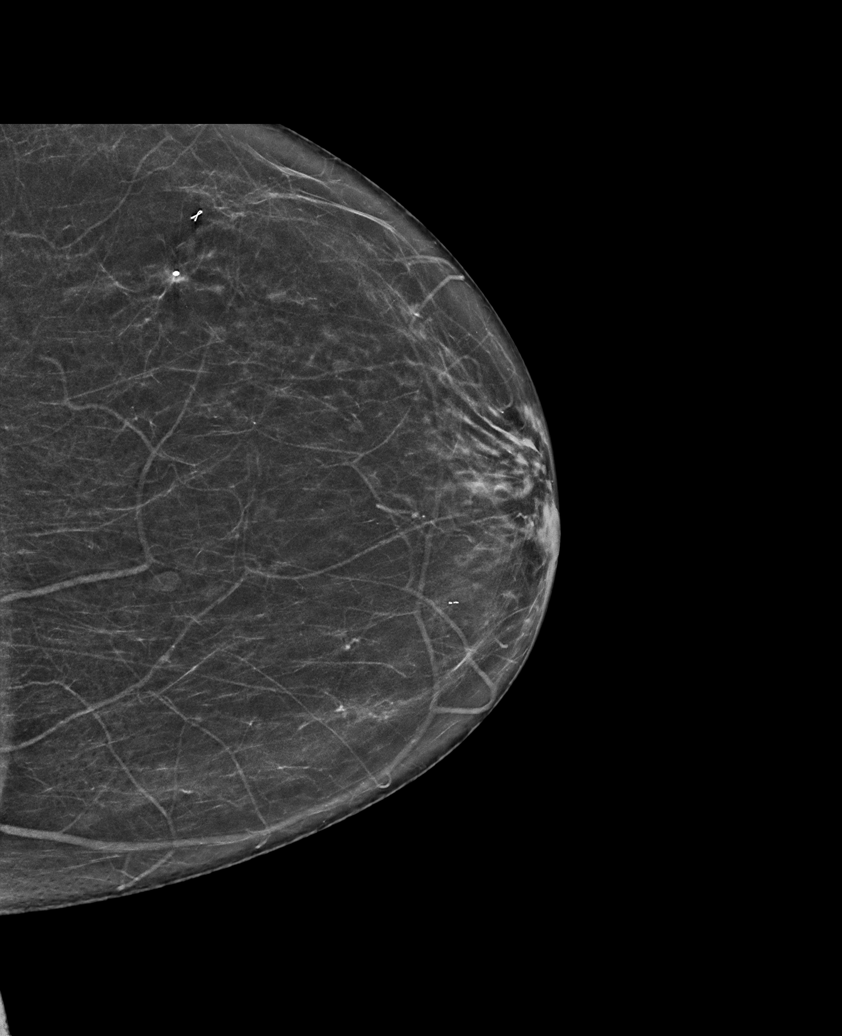

[R CC synth-2D]
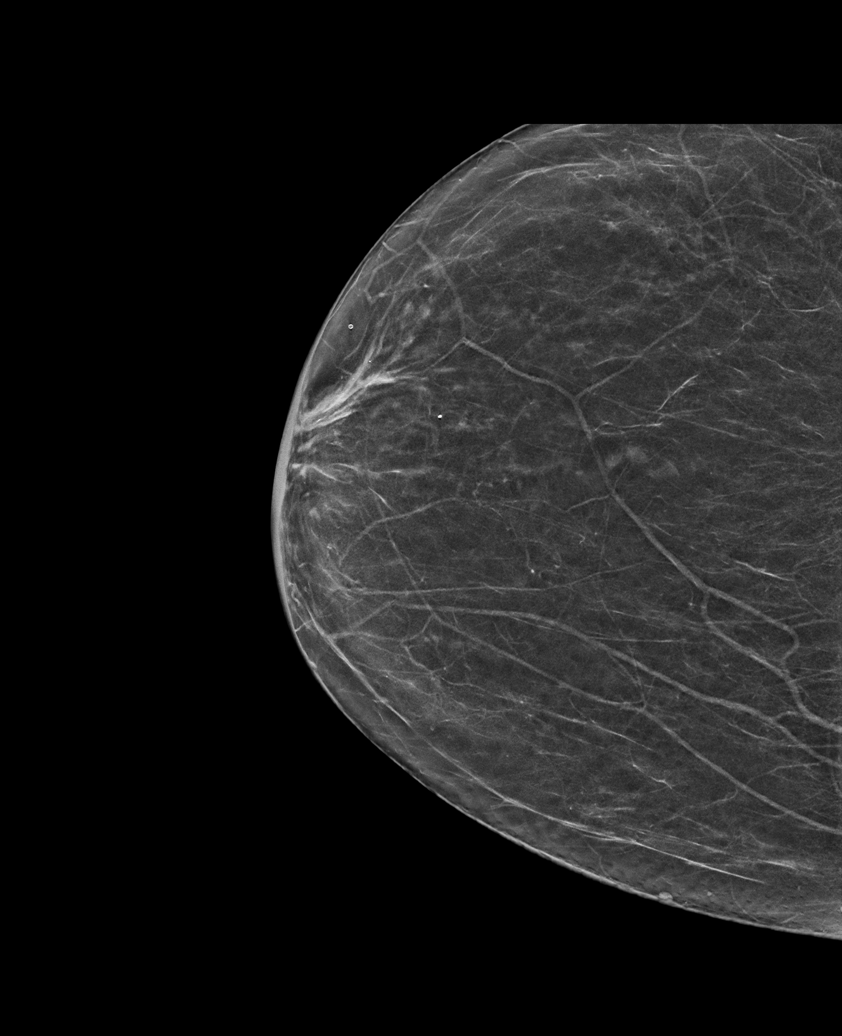

[L MLO synth-2D (1 of 2)]
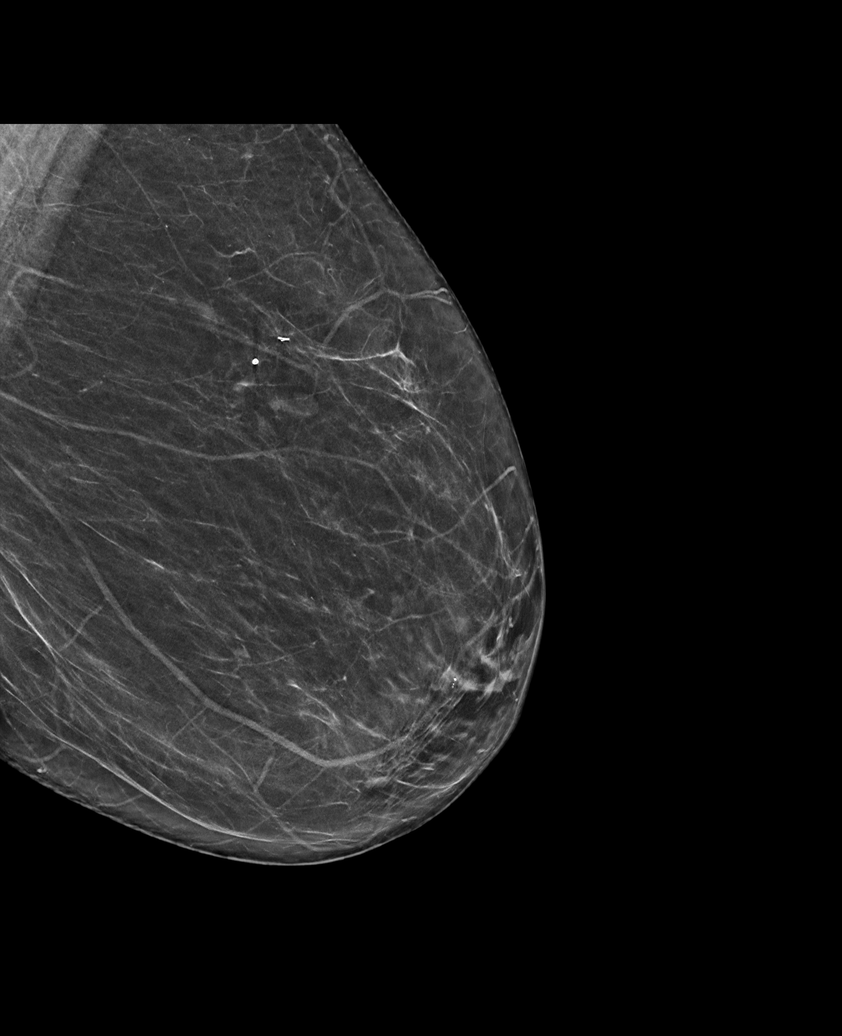

[R MLO synth-2D]
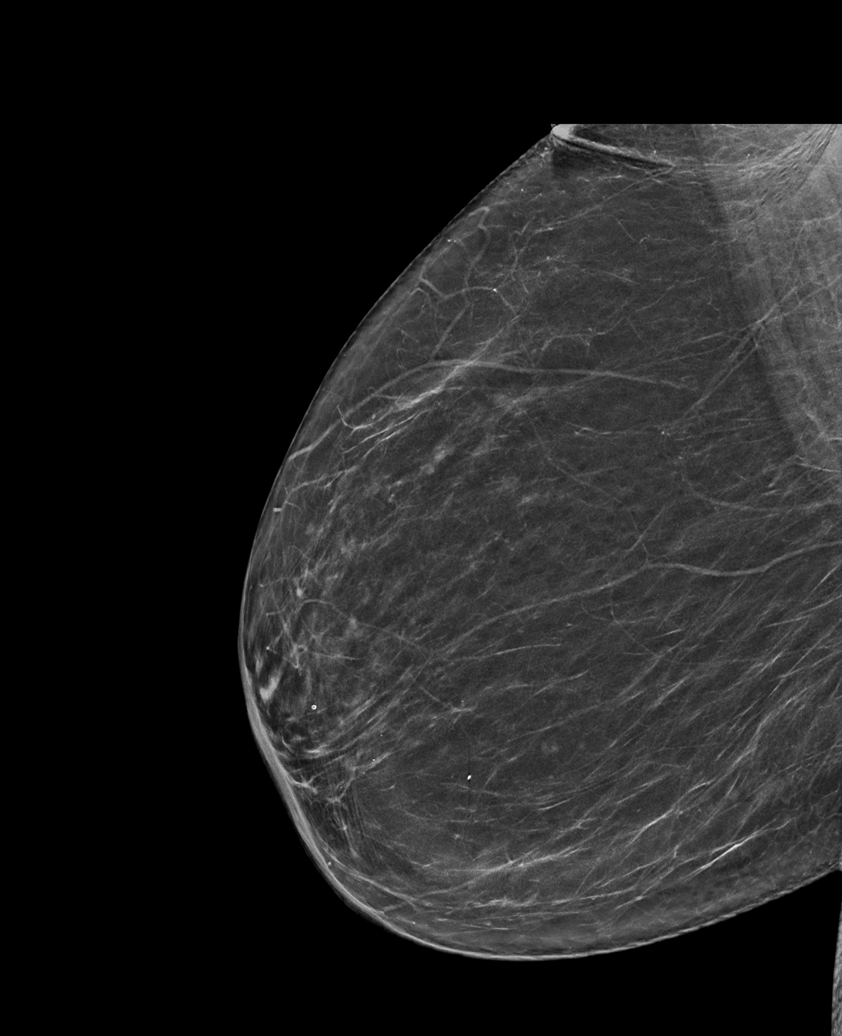

[L MLO synth-2D (2 of 2)]
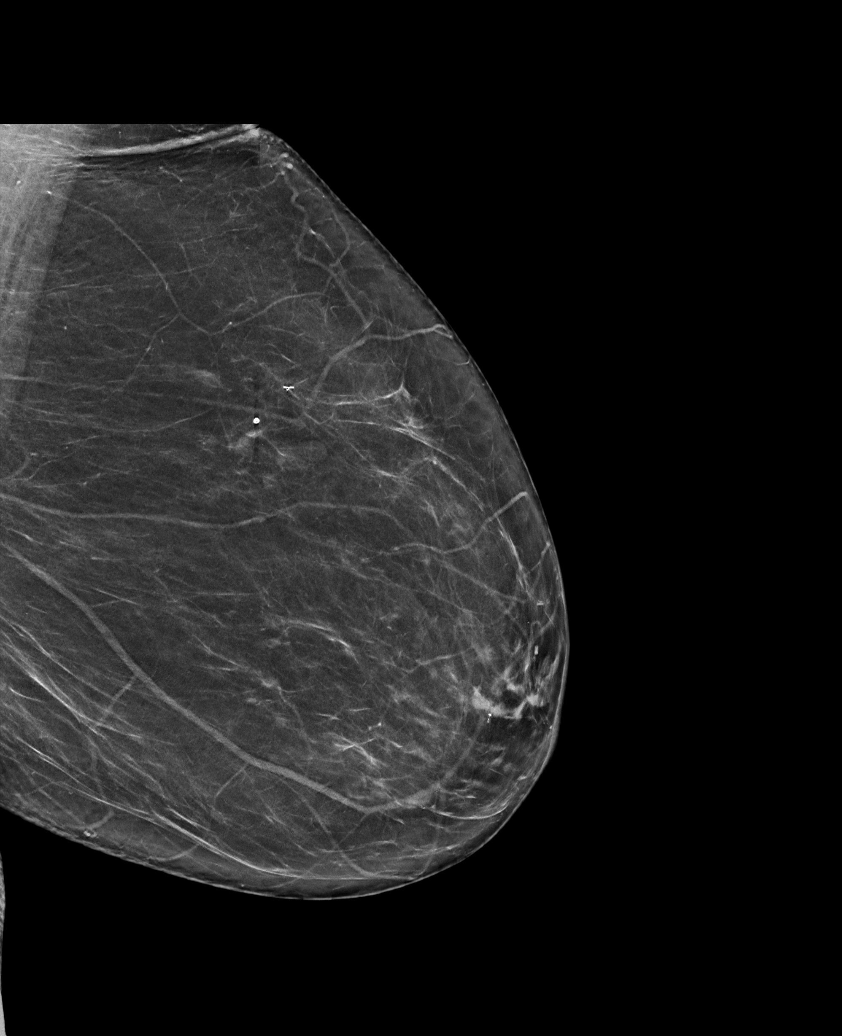

[L MLO tomo · tomo slice 37/73.0]
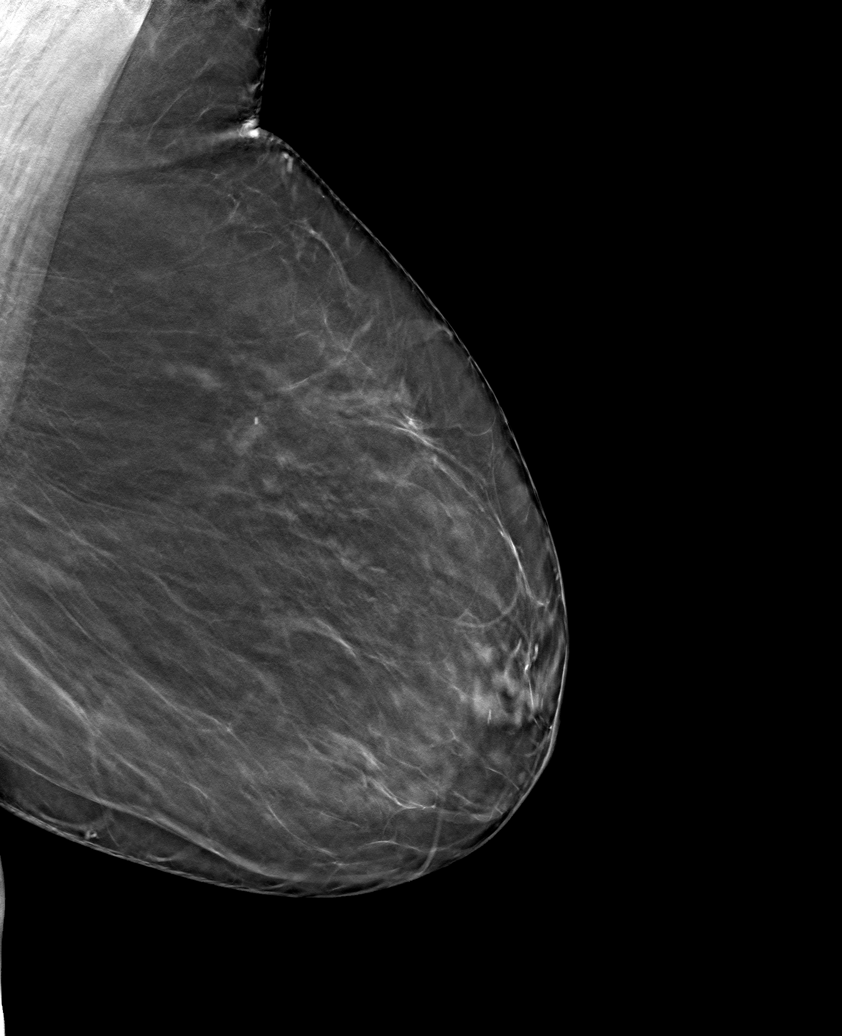

[6 of 30 positions shown; findings below may reference images not displayed]

ACR Breast Density Category b: There are scattered areas of
fibroglandular density.
FINDINGS: There are no findings suspicious for malignancy. Images were
processed with CAD.
IMPRESSION: No mammographic evidence of malignancy. A result letter of this
screening mammogram will be mailed directly to the patient.

RECOMMENDATION:
Screening mammogram in one year. (Code:[id number])

BI-RADS CATEGORY  1: Negative.

## 2020-09-14 DIAGNOSIS — J939 Pneumothorax, unspecified: Secondary | ICD-10-CM | POA: Diagnosis not present

## 2020-09-14 DIAGNOSIS — T797XXA Traumatic subcutaneous emphysema, initial encounter: Secondary | ICD-10-CM | POA: Diagnosis not present

## 2020-09-14 DIAGNOSIS — R918 Other nonspecific abnormal finding of lung field: Secondary | ICD-10-CM | POA: Diagnosis not present

## 2020-09-15 DIAGNOSIS — I4891 Unspecified atrial fibrillation: Secondary | ICD-10-CM | POA: Diagnosis not present

## 2020-09-15 DIAGNOSIS — U071 COVID-19: Secondary | ICD-10-CM | POA: Diagnosis not present

## 2020-09-15 DIAGNOSIS — J1282 Pneumonia due to coronavirus disease 2019: Secondary | ICD-10-CM | POA: Diagnosis not present

## 2020-09-15 DIAGNOSIS — K219 Gastro-esophageal reflux disease without esophagitis: Secondary | ICD-10-CM | POA: Diagnosis not present

## 2020-09-15 DIAGNOSIS — J9600 Acute respiratory failure, unspecified whether with hypoxia or hypercapnia: Secondary | ICD-10-CM | POA: Diagnosis not present

## 2020-09-15 DIAGNOSIS — I10 Essential (primary) hypertension: Secondary | ICD-10-CM | POA: Diagnosis not present

## 2020-09-15 DIAGNOSIS — Z940 Kidney transplant status: Secondary | ICD-10-CM | POA: Diagnosis not present

## 2020-09-15 DIAGNOSIS — N179 Acute kidney failure, unspecified: Secondary | ICD-10-CM | POA: Diagnosis not present

## 2020-09-15 DIAGNOSIS — I959 Hypotension, unspecified: Secondary | ICD-10-CM | POA: Diagnosis not present

## 2020-09-15 DIAGNOSIS — Z9911 Dependence on respirator [ventilator] status: Secondary | ICD-10-CM | POA: Diagnosis not present

## 2020-09-15 DIAGNOSIS — E119 Type 2 diabetes mellitus without complications: Secondary | ICD-10-CM | POA: Diagnosis not present

## 2020-09-15 DIAGNOSIS — T8182XA Emphysema (subcutaneous) resulting from a procedure, initial encounter: Secondary | ICD-10-CM | POA: Diagnosis not present

## 2020-09-16 DIAGNOSIS — Z79899 Other long term (current) drug therapy: Secondary | ICD-10-CM | POA: Diagnosis not present

## 2020-09-16 DIAGNOSIS — J9600 Acute respiratory failure, unspecified whether with hypoxia or hypercapnia: Secondary | ICD-10-CM | POA: Diagnosis not present

## 2020-09-16 DIAGNOSIS — E119 Type 2 diabetes mellitus without complications: Secondary | ICD-10-CM | POA: Diagnosis not present

## 2020-09-16 DIAGNOSIS — J982 Interstitial emphysema: Secondary | ICD-10-CM | POA: Diagnosis not present

## 2020-09-16 DIAGNOSIS — Z794 Long term (current) use of insulin: Secondary | ICD-10-CM | POA: Diagnosis not present

## 2020-09-16 DIAGNOSIS — U071 COVID-19: Secondary | ICD-10-CM | POA: Diagnosis not present

## 2020-09-16 DIAGNOSIS — J9601 Acute respiratory failure with hypoxia: Secondary | ICD-10-CM | POA: Diagnosis not present

## 2020-09-16 DIAGNOSIS — Z940 Kidney transplant status: Secondary | ICD-10-CM | POA: Diagnosis not present

## 2020-09-16 DIAGNOSIS — N179 Acute kidney failure, unspecified: Secondary | ICD-10-CM | POA: Diagnosis not present

## 2020-09-16 DIAGNOSIS — K219 Gastro-esophageal reflux disease without esophagitis: Secondary | ICD-10-CM | POA: Diagnosis not present

## 2020-09-16 DIAGNOSIS — I4891 Unspecified atrial fibrillation: Secondary | ICD-10-CM | POA: Diagnosis not present

## 2020-09-16 DIAGNOSIS — I10 Essential (primary) hypertension: Secondary | ICD-10-CM | POA: Diagnosis not present

## 2020-09-16 DIAGNOSIS — J1282 Pneumonia due to coronavirus disease 2019: Secondary | ICD-10-CM | POA: Diagnosis not present

## 2020-09-17 DIAGNOSIS — J1282 Pneumonia due to coronavirus disease 2019: Secondary | ICD-10-CM | POA: Diagnosis not present

## 2020-09-17 DIAGNOSIS — Z792 Long term (current) use of antibiotics: Secondary | ICD-10-CM | POA: Diagnosis not present

## 2020-09-17 DIAGNOSIS — J9610 Chronic respiratory failure, unspecified whether with hypoxia or hypercapnia: Secondary | ICD-10-CM | POA: Diagnosis not present

## 2020-09-17 DIAGNOSIS — N179 Acute kidney failure, unspecified: Secondary | ICD-10-CM | POA: Diagnosis not present

## 2020-09-17 DIAGNOSIS — I4891 Unspecified atrial fibrillation: Secondary | ICD-10-CM | POA: Diagnosis not present

## 2020-09-17 DIAGNOSIS — J439 Emphysema, unspecified: Secondary | ICD-10-CM | POA: Diagnosis not present

## 2020-09-17 DIAGNOSIS — K219 Gastro-esophageal reflux disease without esophagitis: Secondary | ICD-10-CM | POA: Diagnosis not present

## 2020-09-17 DIAGNOSIS — J9600 Acute respiratory failure, unspecified whether with hypoxia or hypercapnia: Secondary | ICD-10-CM | POA: Diagnosis not present

## 2020-09-17 DIAGNOSIS — I10 Essential (primary) hypertension: Secondary | ICD-10-CM | POA: Diagnosis not present

## 2020-09-17 DIAGNOSIS — E876 Hypokalemia: Secondary | ICD-10-CM | POA: Diagnosis not present

## 2020-09-17 DIAGNOSIS — E119 Type 2 diabetes mellitus without complications: Secondary | ICD-10-CM | POA: Diagnosis not present

## 2020-09-17 DIAGNOSIS — Z940 Kidney transplant status: Secondary | ICD-10-CM | POA: Diagnosis not present

## 2020-09-17 DIAGNOSIS — U071 COVID-19: Secondary | ICD-10-CM | POA: Diagnosis not present

## 2020-09-18 DIAGNOSIS — E876 Hypokalemia: Secondary | ICD-10-CM | POA: Diagnosis not present

## 2020-09-18 DIAGNOSIS — I10 Essential (primary) hypertension: Secondary | ICD-10-CM | POA: Diagnosis not present

## 2020-09-18 DIAGNOSIS — D649 Anemia, unspecified: Secondary | ICD-10-CM | POA: Diagnosis not present

## 2020-09-18 DIAGNOSIS — U071 COVID-19: Secondary | ICD-10-CM | POA: Diagnosis not present

## 2020-09-18 DIAGNOSIS — Z4682 Encounter for fitting and adjustment of non-vascular catheter: Secondary | ICD-10-CM | POA: Diagnosis not present

## 2020-09-18 DIAGNOSIS — J1282 Pneumonia due to coronavirus disease 2019: Secondary | ICD-10-CM | POA: Diagnosis not present

## 2020-09-18 DIAGNOSIS — J939 Pneumothorax, unspecified: Secondary | ICD-10-CM | POA: Diagnosis not present

## 2020-09-18 DIAGNOSIS — R918 Other nonspecific abnormal finding of lung field: Secondary | ICD-10-CM | POA: Diagnosis not present

## 2020-09-18 DIAGNOSIS — N179 Acute kidney failure, unspecified: Secondary | ICD-10-CM | POA: Diagnosis not present

## 2020-09-18 DIAGNOSIS — J9600 Acute respiratory failure, unspecified whether with hypoxia or hypercapnia: Secondary | ICD-10-CM | POA: Diagnosis not present

## 2020-09-18 DIAGNOSIS — I4891 Unspecified atrial fibrillation: Secondary | ICD-10-CM | POA: Diagnosis not present

## 2020-09-18 DIAGNOSIS — Z940 Kidney transplant status: Secondary | ICD-10-CM | POA: Diagnosis not present

## 2020-09-18 DIAGNOSIS — I959 Hypotension, unspecified: Secondary | ICD-10-CM | POA: Diagnosis not present

## 2020-09-18 DIAGNOSIS — T797XXA Traumatic subcutaneous emphysema, initial encounter: Secondary | ICD-10-CM | POA: Diagnosis not present

## 2020-09-18 DIAGNOSIS — D696 Thrombocytopenia, unspecified: Secondary | ICD-10-CM | POA: Diagnosis not present

## 2020-09-19 DIAGNOSIS — E119 Type 2 diabetes mellitus without complications: Secondary | ICD-10-CM | POA: Diagnosis not present

## 2020-09-19 DIAGNOSIS — K219 Gastro-esophageal reflux disease without esophagitis: Secondary | ICD-10-CM | POA: Diagnosis not present

## 2020-09-19 DIAGNOSIS — J1282 Pneumonia due to coronavirus disease 2019: Secondary | ICD-10-CM | POA: Diagnosis not present

## 2020-09-19 DIAGNOSIS — D649 Anemia, unspecified: Secondary | ICD-10-CM | POA: Diagnosis not present

## 2020-09-19 DIAGNOSIS — I10 Essential (primary) hypertension: Secondary | ICD-10-CM | POA: Diagnosis not present

## 2020-09-19 DIAGNOSIS — J9600 Acute respiratory failure, unspecified whether with hypoxia or hypercapnia: Secondary | ICD-10-CM | POA: Diagnosis not present

## 2020-09-19 DIAGNOSIS — I4891 Unspecified atrial fibrillation: Secondary | ICD-10-CM | POA: Diagnosis not present

## 2020-09-19 DIAGNOSIS — N179 Acute kidney failure, unspecified: Secondary | ICD-10-CM | POA: Diagnosis not present

## 2020-09-19 DIAGNOSIS — T797XXA Traumatic subcutaneous emphysema, initial encounter: Secondary | ICD-10-CM | POA: Diagnosis not present

## 2020-09-19 DIAGNOSIS — J939 Pneumothorax, unspecified: Secondary | ICD-10-CM | POA: Diagnosis not present

## 2020-09-19 DIAGNOSIS — D696 Thrombocytopenia, unspecified: Secondary | ICD-10-CM | POA: Diagnosis not present

## 2020-09-19 DIAGNOSIS — U071 COVID-19: Secondary | ICD-10-CM | POA: Diagnosis not present

## 2020-09-19 DIAGNOSIS — Z940 Kidney transplant status: Secondary | ICD-10-CM | POA: Diagnosis not present

## 2020-09-20 DIAGNOSIS — J939 Pneumothorax, unspecified: Secondary | ICD-10-CM | POA: Diagnosis not present

## 2020-09-20 DIAGNOSIS — J1282 Pneumonia due to coronavirus disease 2019: Secondary | ICD-10-CM | POA: Diagnosis not present

## 2020-09-20 DIAGNOSIS — K219 Gastro-esophageal reflux disease without esophagitis: Secondary | ICD-10-CM | POA: Diagnosis not present

## 2020-09-20 DIAGNOSIS — T797XXA Traumatic subcutaneous emphysema, initial encounter: Secondary | ICD-10-CM | POA: Diagnosis not present

## 2020-09-20 DIAGNOSIS — U071 COVID-19: Secondary | ICD-10-CM | POA: Diagnosis not present

## 2020-09-20 DIAGNOSIS — R0602 Shortness of breath: Secondary | ICD-10-CM | POA: Diagnosis not present

## 2020-09-20 DIAGNOSIS — I340 Nonrheumatic mitral (valve) insufficiency: Secondary | ICD-10-CM | POA: Diagnosis not present

## 2020-09-20 DIAGNOSIS — I959 Hypotension, unspecified: Secondary | ICD-10-CM | POA: Diagnosis not present

## 2020-09-20 DIAGNOSIS — I10 Essential (primary) hypertension: Secondary | ICD-10-CM | POA: Diagnosis not present

## 2020-09-20 DIAGNOSIS — J9600 Acute respiratory failure, unspecified whether with hypoxia or hypercapnia: Secondary | ICD-10-CM | POA: Diagnosis not present

## 2020-09-20 DIAGNOSIS — I4891 Unspecified atrial fibrillation: Secondary | ICD-10-CM | POA: Diagnosis not present

## 2020-09-20 DIAGNOSIS — D649 Anemia, unspecified: Secondary | ICD-10-CM | POA: Diagnosis not present

## 2020-09-20 DIAGNOSIS — N179 Acute kidney failure, unspecified: Secondary | ICD-10-CM | POA: Diagnosis not present

## 2020-09-20 DIAGNOSIS — E119 Type 2 diabetes mellitus without complications: Secondary | ICD-10-CM | POA: Diagnosis not present

## 2020-09-20 DIAGNOSIS — Z940 Kidney transplant status: Secondary | ICD-10-CM | POA: Diagnosis not present

## 2020-09-21 DIAGNOSIS — N170 Acute kidney failure with tubular necrosis: Secondary | ICD-10-CM | POA: Diagnosis not present

## 2020-09-21 DIAGNOSIS — J1282 Pneumonia due to coronavirus disease 2019: Secondary | ICD-10-CM | POA: Diagnosis not present

## 2020-09-21 DIAGNOSIS — N179 Acute kidney failure, unspecified: Secondary | ICD-10-CM | POA: Diagnosis not present

## 2020-09-21 DIAGNOSIS — I4891 Unspecified atrial fibrillation: Secondary | ICD-10-CM | POA: Diagnosis not present

## 2020-09-21 DIAGNOSIS — J9600 Acute respiratory failure, unspecified whether with hypoxia or hypercapnia: Secondary | ICD-10-CM | POA: Diagnosis not present

## 2020-09-21 DIAGNOSIS — Z940 Kidney transplant status: Secondary | ICD-10-CM | POA: Diagnosis not present

## 2020-09-21 DIAGNOSIS — J9601 Acute respiratory failure with hypoxia: Secondary | ICD-10-CM | POA: Diagnosis not present

## 2020-09-21 DIAGNOSIS — U071 COVID-19: Secondary | ICD-10-CM | POA: Diagnosis not present

## 2020-09-22 DIAGNOSIS — U071 COVID-19: Secondary | ICD-10-CM | POA: Diagnosis not present

## 2020-09-22 DIAGNOSIS — J1282 Pneumonia due to coronavirus disease 2019: Secondary | ICD-10-CM | POA: Diagnosis not present

## 2020-09-22 DIAGNOSIS — E119 Type 2 diabetes mellitus without complications: Secondary | ICD-10-CM | POA: Diagnosis not present

## 2020-09-22 DIAGNOSIS — J982 Interstitial emphysema: Secondary | ICD-10-CM | POA: Diagnosis not present

## 2020-09-22 DIAGNOSIS — J939 Pneumothorax, unspecified: Secondary | ICD-10-CM | POA: Diagnosis not present

## 2020-09-22 DIAGNOSIS — R791 Abnormal coagulation profile: Secondary | ICD-10-CM | POA: Diagnosis not present

## 2020-09-22 DIAGNOSIS — D696 Thrombocytopenia, unspecified: Secondary | ICD-10-CM | POA: Diagnosis not present

## 2020-09-22 DIAGNOSIS — I10 Essential (primary) hypertension: Secondary | ICD-10-CM | POA: Diagnosis not present

## 2020-09-22 DIAGNOSIS — E440 Moderate protein-calorie malnutrition: Secondary | ICD-10-CM | POA: Diagnosis not present

## 2020-09-22 DIAGNOSIS — R0603 Acute respiratory distress: Secondary | ICD-10-CM | POA: Diagnosis not present

## 2020-09-22 DIAGNOSIS — E785 Hyperlipidemia, unspecified: Secondary | ICD-10-CM | POA: Diagnosis not present

## 2020-09-22 DIAGNOSIS — R918 Other nonspecific abnormal finding of lung field: Secondary | ICD-10-CM | POA: Diagnosis not present

## 2020-10-13 DEATH — deceased
